# Patient Record
Sex: Female | Born: 1940 | Race: White | Hispanic: No | Smoking: Current every day smoker
Health system: Southern US, Community
[De-identification: ages and names within clinical notes are randomized; demographics above are authoritative.]

## PROBLEM LIST (undated history)

## (undated) DIAGNOSIS — K219 Gastro-esophageal reflux disease without esophagitis: Secondary | ICD-10-CM

## (undated) DIAGNOSIS — I10 Essential (primary) hypertension: Secondary | ICD-10-CM

## (undated) DIAGNOSIS — R0602 Shortness of breath: Secondary | ICD-10-CM

## (undated) DIAGNOSIS — C3490 Malignant neoplasm of unspecified part of unspecified bronchus or lung: Secondary | ICD-10-CM

## (undated) DIAGNOSIS — M549 Dorsalgia, unspecified: Secondary | ICD-10-CM

## (undated) DIAGNOSIS — G8929 Other chronic pain: Secondary | ICD-10-CM

## (undated) HISTORY — PX: APPENDECTOMY: SHX54

---

## 1970-09-18 HISTORY — PX: ABDOMINAL HYSTERECTOMY: SHX81

## 1999-05-13 ENCOUNTER — Encounter: Payer: Self-pay | Admitting: Internal Medicine

## 1999-05-13 ENCOUNTER — Ambulatory Visit (HOSPITAL_COMMUNITY): Admission: RE | Admit: 1999-05-13 | Discharge: 1999-05-13 | Payer: Self-pay | Admitting: Internal Medicine

## 2006-10-19 ENCOUNTER — Emergency Department (HOSPITAL_COMMUNITY): Admission: EM | Admit: 2006-10-19 | Discharge: 2006-10-19 | Payer: Self-pay | Admitting: Family Medicine

## 2008-04-16 ENCOUNTER — Ambulatory Visit: Payer: Self-pay | Admitting: Thoracic Surgery

## 2008-04-18 HISTORY — PX: FIBEROPTIC BRONCHOSCOPY: SHX5367

## 2008-04-22 ENCOUNTER — Ambulatory Visit: Payer: Self-pay | Admitting: Thoracic Surgery

## 2008-04-22 ENCOUNTER — Ambulatory Visit (HOSPITAL_COMMUNITY): Admission: RE | Admit: 2008-04-22 | Discharge: 2008-04-22 | Payer: Self-pay | Admitting: Thoracic Surgery

## 2008-04-24 ENCOUNTER — Ambulatory Visit (HOSPITAL_COMMUNITY): Admission: RE | Admit: 2008-04-24 | Discharge: 2008-04-24 | Payer: Self-pay | Admitting: Thoracic Surgery

## 2008-04-24 ENCOUNTER — Encounter: Payer: Self-pay | Admitting: Thoracic Surgery

## 2008-04-27 ENCOUNTER — Ambulatory Visit: Admission: RE | Admit: 2008-04-27 | Discharge: 2008-04-27 | Payer: Self-pay | Admitting: Thoracic Surgery

## 2008-04-28 ENCOUNTER — Ambulatory Visit: Payer: Self-pay | Admitting: Thoracic Surgery

## 2008-04-29 ENCOUNTER — Ambulatory Visit: Payer: Self-pay | Admitting: Internal Medicine

## 2008-05-04 LAB — CBC WITH DIFFERENTIAL/PLATELET
BASO%: 1 % (ref 0.0–2.0)
EOS%: 3.4 % (ref 0.0–7.0)
HCT: 40.7 % (ref 34.8–46.6)
LYMPH%: 23.6 % (ref 14.0–48.0)
MCH: 33.8 pg (ref 26.0–34.0)
MCHC: 34.3 g/dL (ref 32.0–36.0)
MCV: 98.6 fL (ref 81.0–101.0)
MONO%: 6.9 % (ref 0.0–13.0)
NEUT%: 65.1 % (ref 39.6–76.8)
Platelets: 533 10*3/uL — ABNORMAL HIGH (ref 145–400)
RBC: 4.13 10*6/uL (ref 3.70–5.32)
WBC: 12.2 10*3/uL — ABNORMAL HIGH (ref 3.9–10.0)

## 2008-05-04 LAB — COMPREHENSIVE METABOLIC PANEL
ALT: 14 U/L (ref 0–35)
Alkaline Phosphatase: 85 U/L (ref 39–117)
CO2: 26 mEq/L (ref 19–32)
Creatinine, Ser: 0.73 mg/dL (ref 0.40–1.20)
Sodium: 135 mEq/L (ref 135–145)
Total Bilirubin: 0.5 mg/dL (ref 0.3–1.2)
Total Protein: 7.3 g/dL (ref 6.0–8.3)

## 2008-05-05 ENCOUNTER — Ambulatory Visit: Admission: RE | Admit: 2008-05-05 | Discharge: 2008-07-04 | Payer: Self-pay | Admitting: Radiation Oncology

## 2008-05-18 LAB — CBC WITH DIFFERENTIAL/PLATELET
Basophils Absolute: 0.1 10*3/uL (ref 0.0–0.1)
Eosinophils Absolute: 0.6 10*3/uL — ABNORMAL HIGH (ref 0.0–0.5)
HGB: 14.1 g/dL (ref 11.6–15.9)
MONO#: 1.1 10*3/uL — ABNORMAL HIGH (ref 0.1–0.9)
NEUT#: 7.2 10*3/uL — ABNORMAL HIGH (ref 1.5–6.5)
RBC: 4.23 10*6/uL (ref 3.70–5.32)
RDW: 11.9 % (ref 11.3–14.5)
WBC: 11.7 10*3/uL — ABNORMAL HIGH (ref 3.9–10.0)

## 2008-05-18 LAB — COMPREHENSIVE METABOLIC PANEL
Albumin: 4.3 g/dL (ref 3.5–5.2)
Alkaline Phosphatase: 91 U/L (ref 39–117)
BUN: 17 mg/dL (ref 6–23)
CO2: 24 mEq/L (ref 19–32)
Glucose, Bld: 88 mg/dL (ref 70–99)
Sodium: 137 mEq/L (ref 135–145)
Total Bilirubin: 0.3 mg/dL (ref 0.3–1.2)
Total Protein: 7 g/dL (ref 6.0–8.3)

## 2008-05-20 ENCOUNTER — Ambulatory Visit: Payer: Self-pay | Admitting: Thoracic Surgery

## 2008-05-26 LAB — CBC WITH DIFFERENTIAL/PLATELET
Basophils Absolute: 0.1 10*3/uL (ref 0.0–0.1)
EOS%: 2.6 % (ref 0.0–7.0)
Eosinophils Absolute: 0.3 10*3/uL (ref 0.0–0.5)
HCT: 37.7 % (ref 34.8–46.6)
HGB: 13.3 g/dL (ref 11.6–15.9)
LYMPH%: 16.3 % (ref 14.0–48.0)
MCH: 33.7 pg (ref 26.0–34.0)
MCV: 95.5 fL (ref 81.0–101.0)
MONO%: 8.8 % (ref 0.0–13.0)
NEUT#: 7.2 10*3/uL — ABNORMAL HIGH (ref 1.5–6.5)
NEUT%: 71.5 % (ref 39.6–76.8)
Platelets: 368 10*3/uL (ref 145–400)
RDW: 11.9 % (ref 11.3–14.5)

## 2008-05-26 LAB — COMPREHENSIVE METABOLIC PANEL
AST: 21 U/L (ref 0–37)
Albumin: 4.2 g/dL (ref 3.5–5.2)
Alkaline Phosphatase: 87 U/L (ref 39–117)
BUN: 16 mg/dL (ref 6–23)
Creatinine, Ser: 0.64 mg/dL (ref 0.40–1.20)
Glucose, Bld: 122 mg/dL — ABNORMAL HIGH (ref 70–99)

## 2008-06-01 LAB — CBC WITH DIFFERENTIAL/PLATELET
BASO%: 1.3 % (ref 0.0–2.0)
Basophils Absolute: 0.1 10*3/uL (ref 0.0–0.1)
EOS%: 1 % (ref 0.0–7.0)
HCT: 38.7 % (ref 34.8–46.6)
HGB: 13.3 g/dL (ref 11.6–15.9)
LYMPH%: 21.6 % (ref 14.0–48.0)
MCH: 32.9 pg (ref 26.0–34.0)
MCHC: 34.5 g/dL (ref 32.0–36.0)
MCV: 95.3 fL (ref 81.0–101.0)
MONO%: 10.2 % (ref 0.0–13.0)
NEUT%: 65.9 % (ref 39.6–76.8)
lymph#: 1.6 10*3/uL (ref 0.9–3.3)

## 2008-06-01 LAB — COMPREHENSIVE METABOLIC PANEL
ALT: 18 U/L (ref 0–35)
AST: 14 U/L (ref 0–37)
Alkaline Phosphatase: 85 U/L (ref 39–117)
BUN: 19 mg/dL (ref 6–23)
Chloride: 104 mEq/L (ref 96–112)
Creatinine, Ser: 0.71 mg/dL (ref 0.40–1.20)
Total Bilirubin: 0.5 mg/dL (ref 0.3–1.2)

## 2008-06-08 LAB — CBC WITH DIFFERENTIAL/PLATELET
BASO%: 1.3 % (ref 0.0–2.0)
Basophils Absolute: 0.1 10*3/uL (ref 0.0–0.1)
EOS%: 0.9 % (ref 0.0–7.0)
Eosinophils Absolute: 0.1 10*3/uL (ref 0.0–0.5)
HCT: 37.5 % (ref 34.8–46.6)
HGB: 13 g/dL (ref 11.6–15.9)
LYMPH%: 17.5 % (ref 14.0–48.0)
MCH: 33.4 pg (ref 26.0–34.0)
MCHC: 34.6 g/dL (ref 32.0–36.0)
MCV: 96.4 fL (ref 81.0–101.0)
MONO#: 0.7 10*3/uL (ref 0.1–0.9)
MONO%: 7.8 % (ref 0.0–13.0)
NEUT#: 6.2 10*3/uL (ref 1.5–6.5)
NEUT%: 72.4 % (ref 39.6–76.8)
Platelets: 385 10*3/uL (ref 145–400)
RBC: 3.89 10*6/uL (ref 3.70–5.32)
RDW: 12.5 % (ref 11.3–14.5)
WBC: 8.5 10*3/uL (ref 3.9–10.0)
lymph#: 1.5 10*3/uL (ref 0.9–3.3)

## 2008-06-08 LAB — COMPREHENSIVE METABOLIC PANEL
ALT: 21 U/L (ref 0–35)
AST: 18 U/L (ref 0–37)
BUN: 11 mg/dL (ref 6–23)
CO2: 24 mEq/L (ref 19–32)
Calcium: 9.6 mg/dL (ref 8.4–10.5)
Chloride: 102 mEq/L (ref 96–112)
Creatinine, Ser: 0.63 mg/dL (ref 0.40–1.20)
Total Bilirubin: 0.7 mg/dL (ref 0.3–1.2)

## 2008-06-15 ENCOUNTER — Ambulatory Visit: Payer: Self-pay | Admitting: Internal Medicine

## 2008-06-15 LAB — CBC WITH DIFFERENTIAL/PLATELET
Basophils Absolute: 0.1 10*3/uL (ref 0.0–0.1)
HCT: 36.1 % (ref 34.8–46.6)
HGB: 12.7 g/dL (ref 11.6–15.9)
LYMPH%: 14.5 % (ref 14.0–48.0)
MONO#: 0.6 10*3/uL (ref 0.1–0.9)
NEUT%: 76.2 % (ref 39.6–76.8)
Platelets: 253 10*3/uL (ref 145–400)
WBC: 8 10*3/uL (ref 3.9–10.0)
lymph#: 1.2 10*3/uL (ref 0.9–3.3)

## 2008-06-15 LAB — COMPREHENSIVE METABOLIC PANEL
ALT: 22 U/L (ref 0–35)
AST: 19 U/L (ref 0–37)
Alkaline Phosphatase: 85 U/L (ref 39–117)
CO2: 24 mEq/L (ref 19–32)
Creatinine, Ser: 0.67 mg/dL (ref 0.40–1.20)
Sodium: 137 mEq/L (ref 135–145)
Total Bilirubin: 0.5 mg/dL (ref 0.3–1.2)
Total Protein: 7.2 g/dL (ref 6.0–8.3)

## 2008-06-22 LAB — COMPREHENSIVE METABOLIC PANEL
AST: 16 U/L (ref 0–37)
Albumin: 4.5 g/dL (ref 3.5–5.2)
Alkaline Phosphatase: 89 U/L (ref 39–117)
Potassium: 4.4 mEq/L (ref 3.5–5.3)
Sodium: 138 mEq/L (ref 135–145)
Total Protein: 7.3 g/dL (ref 6.0–8.3)

## 2008-06-22 LAB — CBC WITH DIFFERENTIAL/PLATELET
EOS%: 0.8 % (ref 0.0–7.0)
MCH: 33.1 pg (ref 26.0–34.0)
MCHC: 35.1 g/dL (ref 32.0–36.0)
MCV: 94.4 fL (ref 81.0–101.0)
MONO%: 9.2 % (ref 0.0–13.0)
RBC: 3.6 10*6/uL — ABNORMAL LOW (ref 3.70–5.32)
RDW: 13.4 % (ref 11.3–14.5)

## 2008-06-29 LAB — CBC WITH DIFFERENTIAL/PLATELET
Basophils Absolute: 0.1 10*3/uL (ref 0.0–0.1)
EOS%: 1.2 % (ref 0.0–7.0)
Eosinophils Absolute: 0.1 10*3/uL (ref 0.0–0.5)
HCT: 33.7 % — ABNORMAL LOW (ref 34.8–46.6)
HGB: 11.7 g/dL (ref 11.6–15.9)
MCH: 33.3 pg (ref 26.0–34.0)
NEUT#: 3.4 10*3/uL (ref 1.5–6.5)
NEUT%: 67.9 % (ref 39.6–76.8)
lymph#: 1.1 10*3/uL (ref 0.9–3.3)

## 2008-06-29 LAB — COMPREHENSIVE METABOLIC PANEL
AST: 20 U/L (ref 0–37)
Alkaline Phosphatase: 78 U/L (ref 39–117)
BUN: 16 mg/dL (ref 6–23)
Calcium: 9.3 mg/dL (ref 8.4–10.5)
Chloride: 104 mEq/L (ref 96–112)
Creatinine, Ser: 0.65 mg/dL (ref 0.40–1.20)
Glucose, Bld: 90 mg/dL (ref 70–99)

## 2008-07-27 ENCOUNTER — Ambulatory Visit (HOSPITAL_COMMUNITY): Admission: RE | Admit: 2008-07-27 | Discharge: 2008-07-27 | Payer: Self-pay | Admitting: Internal Medicine

## 2008-07-30 LAB — CBC WITH DIFFERENTIAL/PLATELET
BASO%: 0.4 % (ref 0.0–2.0)
EOS%: 0.8 % (ref 0.0–7.0)
HCT: 40.1 % (ref 34.8–46.6)
MCH: 34.3 pg — ABNORMAL HIGH (ref 26.0–34.0)
MCHC: 33.9 g/dL (ref 32.0–36.0)
MCV: 101.3 fL — ABNORMAL HIGH (ref 81.0–101.0)
MONO%: 9.2 % (ref 0.0–13.0)
NEUT%: 77.4 % — ABNORMAL HIGH (ref 39.6–76.8)
lymph#: 1.3 10*3/uL (ref 0.9–3.3)

## 2008-07-30 LAB — COMPREHENSIVE METABOLIC PANEL
ALT: 13 U/L (ref 0–35)
AST: 16 U/L (ref 0–37)
Albumin: 4.5 g/dL (ref 3.5–5.2)
Alkaline Phosphatase: 84 U/L (ref 39–117)
Potassium: 4.1 mEq/L (ref 3.5–5.3)
Sodium: 141 mEq/L (ref 135–145)
Total Bilirubin: 0.4 mg/dL (ref 0.3–1.2)
Total Protein: 7.3 g/dL (ref 6.0–8.3)

## 2008-07-31 ENCOUNTER — Ambulatory Visit: Payer: Self-pay | Admitting: Internal Medicine

## 2008-08-04 LAB — CBC WITH DIFFERENTIAL/PLATELET
EOS%: 0 % (ref 0.0–7.0)
LYMPH%: 2.1 % — ABNORMAL LOW (ref 14.0–48.0)
MCH: 33.8 pg (ref 26.0–34.0)
MCV: 99.9 fL (ref 81.0–101.0)
MONO%: 2.2 % (ref 0.0–13.0)
Platelets: 419 10*3/uL — ABNORMAL HIGH (ref 145–400)
RBC: 3.89 10*6/uL (ref 3.70–5.32)
RDW: 15 % — ABNORMAL HIGH (ref 11.3–14.5)

## 2008-08-04 LAB — COMPREHENSIVE METABOLIC PANEL
AST: 14 U/L (ref 0–37)
Albumin: 4.6 g/dL (ref 3.5–5.2)
Alkaline Phosphatase: 77 U/L (ref 39–117)
BUN: 16 mg/dL (ref 6–23)
Glucose, Bld: 121 mg/dL — ABNORMAL HIGH (ref 70–99)
Potassium: 4.2 mEq/L (ref 3.5–5.3)
Sodium: 140 mEq/L (ref 135–145)
Total Bilirubin: 0.3 mg/dL (ref 0.3–1.2)
Total Protein: 7.3 g/dL (ref 6.0–8.3)

## 2008-08-11 LAB — CBC WITH DIFFERENTIAL/PLATELET
Basophils Absolute: 0 10*3/uL (ref 0.0–0.1)
EOS%: 0.4 % (ref 0.0–7.0)
Eosinophils Absolute: 0.1 10*3/uL (ref 0.0–0.5)
HGB: 12.1 g/dL (ref 11.6–15.9)
LYMPH%: 7.5 % — ABNORMAL LOW (ref 14.0–48.0)
MCH: 34.7 pg — ABNORMAL HIGH (ref 26.0–34.0)
MCV: 101.3 fL — ABNORMAL HIGH (ref 81.0–101.0)
MONO%: 7 % (ref 0.0–13.0)
NEUT#: 17.2 10*3/uL — ABNORMAL HIGH (ref 1.5–6.5)
Platelets: 222 10*3/uL (ref 145–400)
RDW: 16.5 % — ABNORMAL HIGH (ref 11.3–14.5)

## 2008-08-11 LAB — COMPREHENSIVE METABOLIC PANEL
AST: 14 U/L (ref 0–37)
Alkaline Phosphatase: 98 U/L (ref 39–117)
BUN: 12 mg/dL (ref 6–23)
Creatinine, Ser: 0.88 mg/dL (ref 0.40–1.20)
Glucose, Bld: 106 mg/dL — ABNORMAL HIGH (ref 70–99)
Total Bilirubin: 0.3 mg/dL (ref 0.3–1.2)

## 2008-08-18 LAB — CBC WITH DIFFERENTIAL/PLATELET
Basophils Absolute: 0 10*3/uL (ref 0.0–0.1)
EOS%: 0.3 % (ref 0.0–7.0)
Eosinophils Absolute: 0.1 10*3/uL (ref 0.0–0.5)
HCT: 37.8 % (ref 34.8–46.6)
HGB: 12.8 g/dL (ref 11.6–15.9)
LYMPH%: 7.1 % — ABNORMAL LOW (ref 14.0–48.0)
MCH: 34.6 pg — ABNORMAL HIGH (ref 26.0–34.0)
MCV: 101.8 fL — ABNORMAL HIGH (ref 81.0–101.0)
MONO%: 3.8 % (ref 0.0–13.0)
NEUT#: 16.4 10*3/uL — ABNORMAL HIGH (ref 1.5–6.5)
NEUT%: 88.8 % — ABNORMAL HIGH (ref 39.6–76.8)
Platelets: 278 10*3/uL (ref 145–400)
RDW: 17.3 % — ABNORMAL HIGH (ref 11.3–14.5)

## 2008-08-18 LAB — COMPREHENSIVE METABOLIC PANEL
AST: 14 U/L (ref 0–37)
Albumin: 4.1 g/dL (ref 3.5–5.2)
Alkaline Phosphatase: 95 U/L (ref 39–117)
BUN: 15 mg/dL (ref 6–23)
Creatinine, Ser: 0.72 mg/dL (ref 0.40–1.20)
Glucose, Bld: 90 mg/dL (ref 70–99)
Potassium: 3.9 mEq/L (ref 3.5–5.3)

## 2008-08-25 LAB — CBC WITH DIFFERENTIAL/PLATELET
Basophils Absolute: 0 10*3/uL (ref 0.0–0.1)
Eosinophils Absolute: 0 10*3/uL (ref 0.0–0.5)
HCT: 36.4 % (ref 34.8–46.6)
HGB: 12.4 g/dL (ref 11.6–15.9)
MCV: 100.8 fL (ref 81.0–101.0)
MONO%: 0.3 % (ref 0.0–13.0)
NEUT#: 10.3 10*3/uL — ABNORMAL HIGH (ref 1.5–6.5)
NEUT%: 96.9 % — ABNORMAL HIGH (ref 39.6–76.8)
RDW: 16.7 % — ABNORMAL HIGH (ref 11.3–14.5)
lymph#: 0.3 10*3/uL — ABNORMAL LOW (ref 0.9–3.3)

## 2008-08-25 LAB — COMPREHENSIVE METABOLIC PANEL
Albumin: 4.6 g/dL (ref 3.5–5.2)
BUN: 21 mg/dL (ref 6–23)
Calcium: 9.9 mg/dL (ref 8.4–10.5)
Chloride: 103 mEq/L (ref 96–112)
Creatinine, Ser: 0.73 mg/dL (ref 0.40–1.20)
Glucose, Bld: 130 mg/dL — ABNORMAL HIGH (ref 70–99)
Potassium: 4.4 mEq/L (ref 3.5–5.3)

## 2008-09-01 LAB — COMPREHENSIVE METABOLIC PANEL
AST: 13 U/L (ref 0–37)
Albumin: 4 g/dL (ref 3.5–5.2)
BUN: 15 mg/dL (ref 6–23)
CO2: 23 mEq/L (ref 19–32)
Calcium: 8.7 mg/dL (ref 8.4–10.5)
Chloride: 104 mEq/L (ref 96–112)
Glucose, Bld: 120 mg/dL — ABNORMAL HIGH (ref 70–99)
Potassium: 4 mEq/L (ref 3.5–5.3)

## 2008-09-01 LAB — CBC WITH DIFFERENTIAL/PLATELET
Basophils Absolute: 0.2 10*3/uL — ABNORMAL HIGH (ref 0.0–0.1)
Eosinophils Absolute: 0 10*3/uL (ref 0.0–0.5)
HCT: 31.9 % — ABNORMAL LOW (ref 34.8–46.6)
HGB: 10.9 g/dL — ABNORMAL LOW (ref 11.6–15.9)
MONO#: 2.2 10*3/uL — ABNORMAL HIGH (ref 0.1–0.9)
NEUT#: 12.1 10*3/uL — ABNORMAL HIGH (ref 1.5–6.5)
RDW: 13.9 % (ref 11.3–14.5)
lymph#: 1 10*3/uL (ref 0.9–3.3)

## 2008-09-08 LAB — CBC WITH DIFFERENTIAL/PLATELET
BASO%: 0.3 % (ref 0.0–2.0)
Basophils Absolute: 0.1 10*3/uL (ref 0.0–0.1)
EOS%: 0.3 % (ref 0.0–7.0)
HGB: 11.9 g/dL (ref 11.6–15.9)
MCH: 34.6 pg — ABNORMAL HIGH (ref 26.0–34.0)
RDW: 15.8 % — ABNORMAL HIGH (ref 11.3–14.5)
WBC: 18.4 10*3/uL — ABNORMAL HIGH (ref 3.9–10.0)
lymph#: 1.1 10*3/uL (ref 0.9–3.3)

## 2008-09-08 LAB — COMPREHENSIVE METABOLIC PANEL
ALT: 14 U/L (ref 0–35)
AST: 14 U/L (ref 0–37)
Albumin: 3.9 g/dL (ref 3.5–5.2)
BUN: 15 mg/dL (ref 6–23)
Calcium: 9 mg/dL (ref 8.4–10.5)
Chloride: 101 mEq/L (ref 96–112)
Potassium: 4.3 mEq/L (ref 3.5–5.3)

## 2008-09-14 ENCOUNTER — Ambulatory Visit: Payer: Self-pay | Admitting: Internal Medicine

## 2008-09-15 LAB — COMPREHENSIVE METABOLIC PANEL
ALT: 19 U/L (ref 0–35)
AST: 17 U/L (ref 0–37)
Albumin: 4.2 g/dL (ref 3.5–5.2)
Calcium: 9.3 mg/dL (ref 8.4–10.5)
Chloride: 107 mEq/L (ref 96–112)
Potassium: 4.5 mEq/L (ref 3.5–5.3)

## 2008-09-15 LAB — CBC WITH DIFFERENTIAL/PLATELET
BASO%: 0.2 % (ref 0.0–2.0)
EOS%: 0 % (ref 0.0–7.0)
MCH: 33.9 pg (ref 26.0–34.0)
MCHC: 34.2 g/dL (ref 32.0–36.0)
RBC: 3.5 10*6/uL — ABNORMAL LOW (ref 3.70–5.32)
RDW: 14.9 % — ABNORMAL HIGH (ref 11.3–14.5)
lymph#: 0.6 10*3/uL — ABNORMAL LOW (ref 0.9–3.3)

## 2008-10-01 ENCOUNTER — Ambulatory Visit (HOSPITAL_COMMUNITY): Admission: RE | Admit: 2008-10-01 | Discharge: 2008-10-01 | Payer: Self-pay | Admitting: Internal Medicine

## 2008-12-29 ENCOUNTER — Ambulatory Visit: Payer: Self-pay | Admitting: Internal Medicine

## 2008-12-31 ENCOUNTER — Ambulatory Visit (HOSPITAL_COMMUNITY): Admission: RE | Admit: 2008-12-31 | Discharge: 2008-12-31 | Payer: Self-pay | Admitting: Internal Medicine

## 2009-04-07 ENCOUNTER — Ambulatory Visit: Payer: Self-pay | Admitting: Internal Medicine

## 2009-04-09 ENCOUNTER — Ambulatory Visit (HOSPITAL_COMMUNITY): Admission: RE | Admit: 2009-04-09 | Discharge: 2009-04-09 | Payer: Self-pay | Admitting: Internal Medicine

## 2009-04-09 LAB — CBC WITH DIFFERENTIAL/PLATELET
Basophils Absolute: 0.1 10*3/uL (ref 0.0–0.1)
EOS%: 0.6 % (ref 0.0–7.0)
Eosinophils Absolute: 0.1 10*3/uL (ref 0.0–0.5)
HGB: 15.9 g/dL (ref 11.6–15.9)
LYMPH%: 15.9 % (ref 14.0–49.7)
MCH: 35.1 pg — ABNORMAL HIGH (ref 25.1–34.0)
MCV: 100.1 fL (ref 79.5–101.0)
MONO%: 8.2 % (ref 0.0–14.0)
Platelets: 276 10*3/uL (ref 145–400)
RBC: 4.52 10*6/uL (ref 3.70–5.45)
RDW: 13.9 % (ref 11.2–14.5)

## 2009-04-09 LAB — COMPREHENSIVE METABOLIC PANEL
AST: 21 U/L (ref 0–37)
Albumin: 4.2 g/dL (ref 3.5–5.2)
Alkaline Phosphatase: 101 U/L (ref 39–117)
BUN: 11 mg/dL (ref 6–23)
Glucose, Bld: 100 mg/dL — ABNORMAL HIGH (ref 70–99)
Potassium: 4.4 mEq/L (ref 3.5–5.3)
Total Bilirubin: 0.8 mg/dL (ref 0.3–1.2)

## 2009-07-08 ENCOUNTER — Ambulatory Visit: Payer: Self-pay | Admitting: Internal Medicine

## 2009-07-12 ENCOUNTER — Ambulatory Visit (HOSPITAL_COMMUNITY): Admission: RE | Admit: 2009-07-12 | Discharge: 2009-07-12 | Payer: Self-pay | Admitting: Internal Medicine

## 2009-07-12 LAB — COMPREHENSIVE METABOLIC PANEL
AST: 22 U/L (ref 0–37)
Albumin: 3.9 g/dL (ref 3.5–5.2)
Alkaline Phosphatase: 87 U/L (ref 39–117)
Potassium: 4.1 mEq/L (ref 3.5–5.3)
Sodium: 141 mEq/L (ref 135–145)
Total Bilirubin: 0.7 mg/dL (ref 0.3–1.2)
Total Protein: 7.2 g/dL (ref 6.0–8.3)

## 2009-07-12 LAB — CBC WITH DIFFERENTIAL/PLATELET
EOS%: 2 % (ref 0.0–7.0)
LYMPH%: 19.2 % (ref 14.0–49.7)
MCH: 35 pg — ABNORMAL HIGH (ref 25.1–34.0)
MCHC: 34.2 g/dL (ref 31.5–36.0)
MCV: 102.3 fL — ABNORMAL HIGH (ref 79.5–101.0)
MONO%: 8.8 % (ref 0.0–14.0)
RBC: 4.33 10*6/uL (ref 3.70–5.45)
RDW: 14.6 % — ABNORMAL HIGH (ref 11.2–14.5)

## 2009-10-05 ENCOUNTER — Ambulatory Visit: Payer: Self-pay | Admitting: Internal Medicine

## 2009-10-07 ENCOUNTER — Ambulatory Visit (HOSPITAL_COMMUNITY): Admission: RE | Admit: 2009-10-07 | Discharge: 2009-10-07 | Payer: Self-pay | Admitting: Internal Medicine

## 2009-10-07 LAB — COMPREHENSIVE METABOLIC PANEL
ALT: 19 U/L (ref 0–35)
AST: 24 U/L (ref 0–37)
Albumin: 4 g/dL (ref 3.5–5.2)
Alkaline Phosphatase: 83 U/L (ref 39–117)
BUN: 6 mg/dL (ref 6–23)
CO2: 26 mEq/L (ref 19–32)
Calcium: 8.9 mg/dL (ref 8.4–10.5)
Glucose, Bld: 86 mg/dL (ref 70–99)
Potassium: 4.3 mEq/L (ref 3.5–5.3)
Total Bilirubin: 0.5 mg/dL (ref 0.3–1.2)
Total Protein: 7.3 g/dL (ref 6.0–8.3)

## 2009-10-07 LAB — CBC WITH DIFFERENTIAL/PLATELET
BASO%: 0.4 % (ref 0.0–2.0)
Eosinophils Absolute: 0.2 10*3/uL (ref 0.0–0.5)
HCT: 45.2 % (ref 34.8–46.6)
MCH: 35 pg — ABNORMAL HIGH (ref 25.1–34.0)
MCHC: 34.1 g/dL (ref 31.5–36.0)
MCV: 102.7 fL — ABNORMAL HIGH (ref 79.5–101.0)
NEUT#: 4.2 10*3/uL (ref 1.5–6.5)
RBC: 4.4 10*6/uL (ref 3.70–5.45)
RDW: 13.6 % (ref 11.2–14.5)

## 2009-10-19 ENCOUNTER — Ambulatory Visit (HOSPITAL_COMMUNITY): Admission: RE | Admit: 2009-10-19 | Discharge: 2009-10-19 | Payer: Self-pay | Admitting: Internal Medicine

## 2010-01-17 ENCOUNTER — Ambulatory Visit (HOSPITAL_COMMUNITY): Admission: RE | Admit: 2010-01-17 | Discharge: 2010-01-17 | Payer: Self-pay | Admitting: Internal Medicine

## 2010-01-17 ENCOUNTER — Ambulatory Visit: Payer: Self-pay | Admitting: Internal Medicine

## 2010-01-17 LAB — COMPREHENSIVE METABOLIC PANEL
Albumin: 4.3 g/dL (ref 3.5–5.2)
Alkaline Phosphatase: 73 U/L (ref 39–117)
BUN: 18 mg/dL (ref 6–23)
CO2: 27 mEq/L (ref 19–32)
Calcium: 9.2 mg/dL (ref 8.4–10.5)
Potassium: 4.3 mEq/L (ref 3.5–5.3)

## 2010-01-17 LAB — CBC WITH DIFFERENTIAL/PLATELET
BASO%: 0.7 % (ref 0.0–2.0)
HCT: 43.1 % (ref 34.8–46.6)
MCH: 34.9 pg — ABNORMAL HIGH (ref 25.1–34.0)
MCV: 101.5 fL — ABNORMAL HIGH (ref 79.5–101.0)
NEUT#: 5.3 10*3/uL (ref 1.5–6.5)
NEUT%: 69.8 % (ref 38.4–76.8)
Platelets: 308 10*3/uL (ref 145–400)
RBC: 4.24 10*6/uL (ref 3.70–5.45)
RDW: 13.8 % (ref 11.2–14.5)
WBC: 7.6 10*3/uL (ref 3.9–10.3)
lymph#: 1.3 10*3/uL (ref 0.9–3.3)

## 2010-02-22 ENCOUNTER — Encounter: Payer: Self-pay | Admitting: Internal Medicine

## 2010-02-22 ENCOUNTER — Observation Stay (HOSPITAL_COMMUNITY): Admission: EM | Admit: 2010-02-22 | Discharge: 2010-02-24 | Payer: Self-pay | Admitting: Emergency Medicine

## 2010-02-22 ENCOUNTER — Ambulatory Visit: Payer: Self-pay | Admitting: Internal Medicine

## 2010-05-17 ENCOUNTER — Ambulatory Visit: Payer: Self-pay | Admitting: Internal Medicine

## 2010-05-19 ENCOUNTER — Ambulatory Visit (HOSPITAL_COMMUNITY)
Admission: RE | Admit: 2010-05-19 | Discharge: 2010-05-19 | Payer: Self-pay | Source: Home / Self Care | Admitting: Internal Medicine

## 2010-05-19 LAB — COMPREHENSIVE METABOLIC PANEL
ALT: 21 U/L (ref 0–35)
AST: 23 U/L (ref 0–37)
Albumin: 4.4 g/dL (ref 3.5–5.2)
Alkaline Phosphatase: 81 U/L (ref 39–117)
Calcium: 9.5 mg/dL (ref 8.4–10.5)
Glucose, Bld: 91 mg/dL (ref 70–99)
Potassium: 4.4 mEq/L (ref 3.5–5.3)
Total Protein: 7.5 g/dL (ref 6.0–8.3)

## 2010-05-19 LAB — CBC WITH DIFFERENTIAL/PLATELET
BASO%: 0.9 % (ref 0.0–2.0)
HCT: 45 % (ref 34.8–46.6)
LYMPH%: 12.6 % — ABNORMAL LOW (ref 14.0–49.7)
MCHC: 34.2 g/dL (ref 31.5–36.0)
MCV: 99.9 fL (ref 79.5–101.0)
RBC: 4.5 10*6/uL (ref 3.70–5.45)
WBC: 8.2 10*3/uL (ref 3.9–10.3)

## 2010-09-05 IMAGING — CT CT CHEST W/ CM
2 of 3 series · 15 of 36 positions shown, 18 images · IV contrast (agent unspecified)
Comparison: Chest radiographs dated 04/22/2008 and PET CT dated
04/22/2008.

CLINICAL DATA: Completed chemotherapy and radiation therapy for
lung cancer diagnosed in [REDACTED] of this year.  Some cough, weight
loss and shortness of breath.

CT CHEST WITH CONTRAST
TECHNIQUE: Multidetector CT imaging of the chest was performed
following the standard protocol during bolus administration of
intravenous contrast.
Contrast: 80 ml Emnipaque-5II

[Series 2: chest routine 5.0 b40f · axial · 0.69mm/px · z∈[-345,-55]mm · 12 of 70 slices shown, 15 images]
[im 6/70  mediastinal]
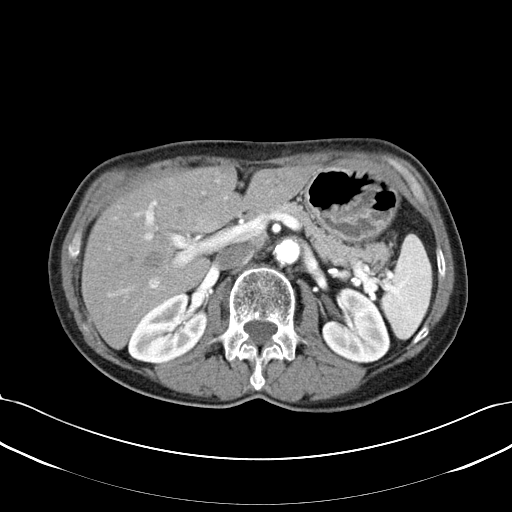
[im 6/70  lung]
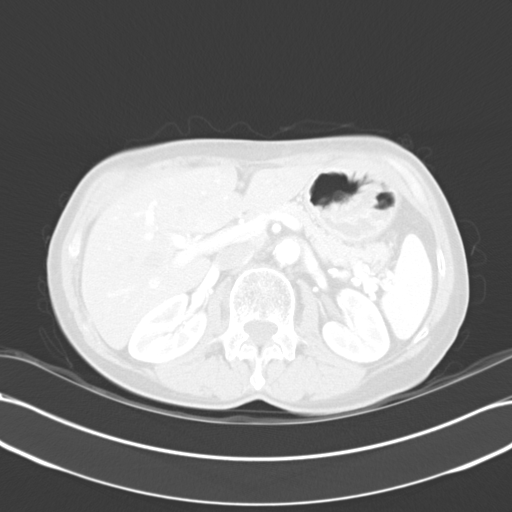
[im 11/70  lung]
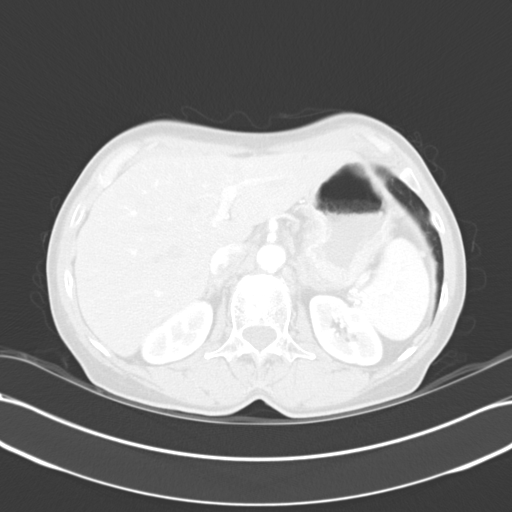
[im 16/70  lung]
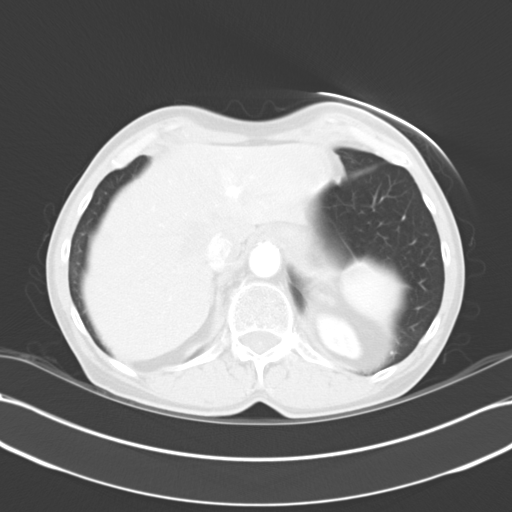
[im 21/70  lung]
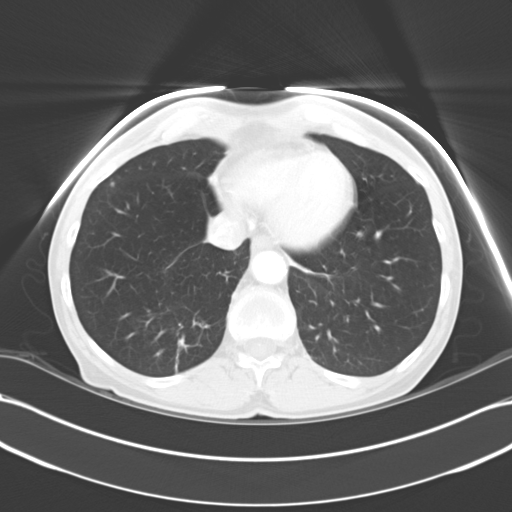
[im 26/70  mediastinal]
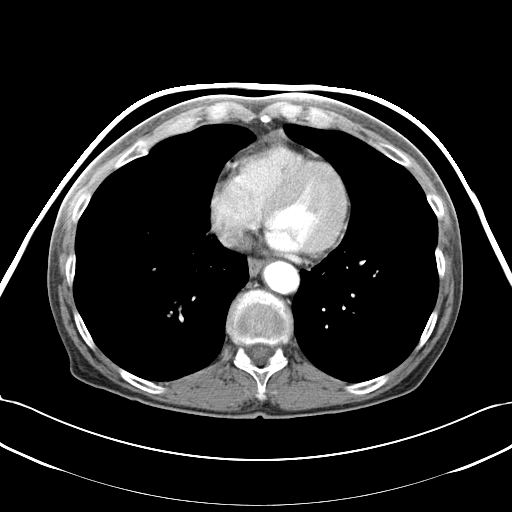
[im 26/70  lung]
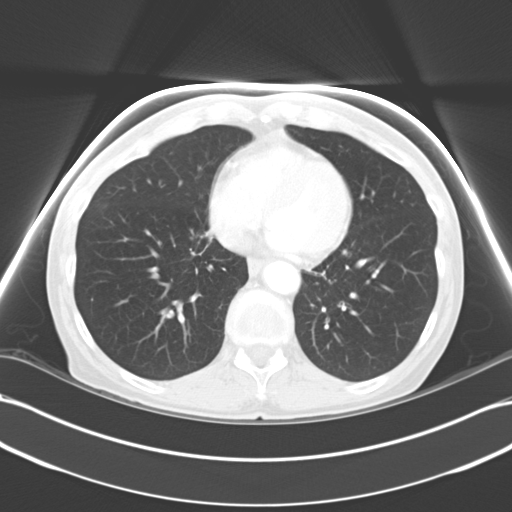
[im 31/70  lung]
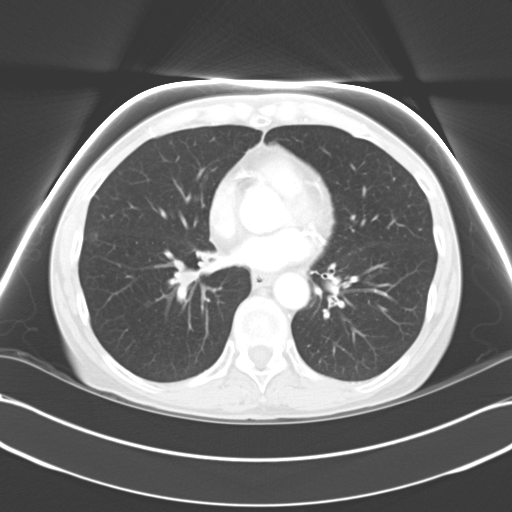
[im 39/70  lung]
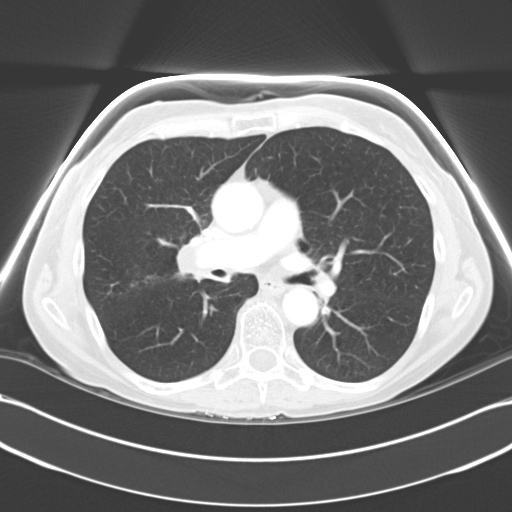
[im 44/70  lung]
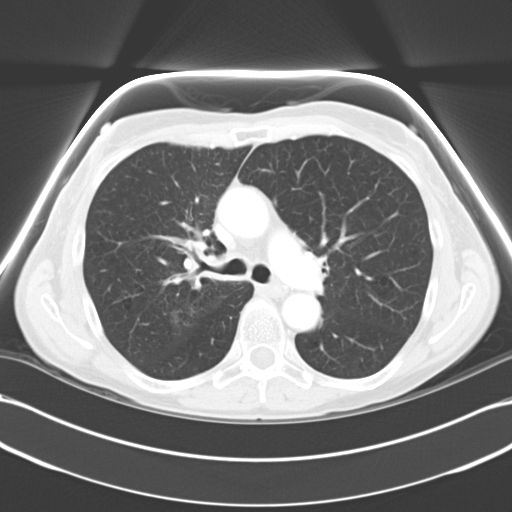
[im 49/70  mediastinal]
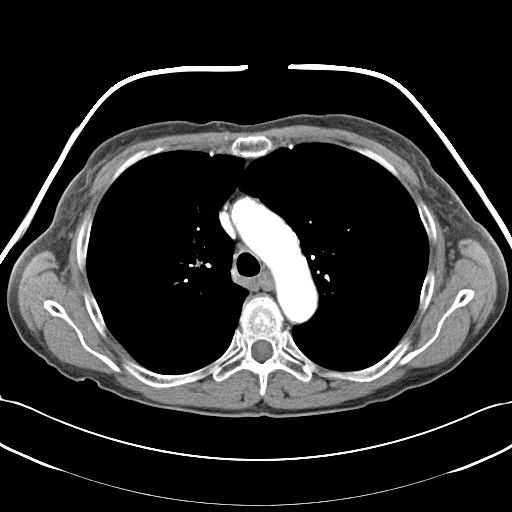
[im 49/70  lung]
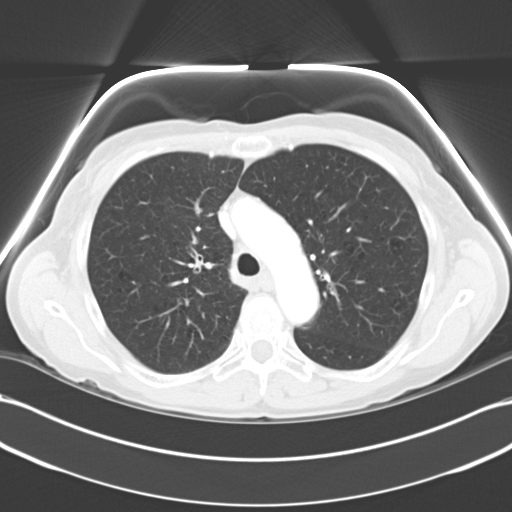
[im 54/70  lung]
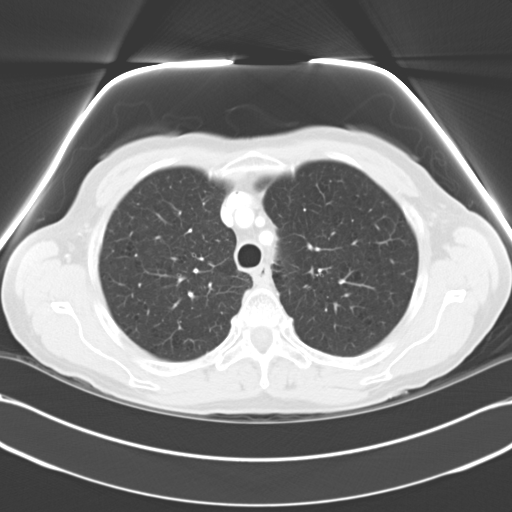
[im 59/70  lung]
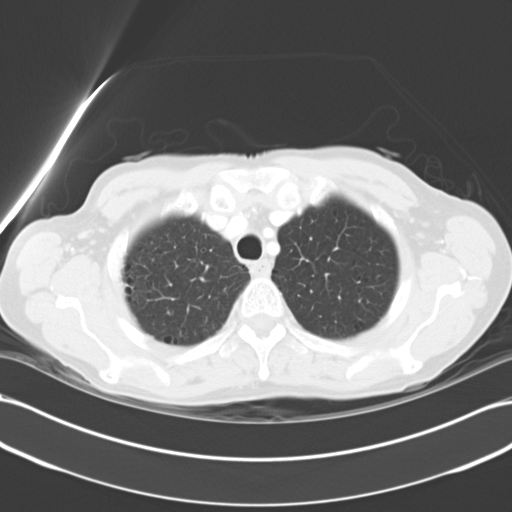
[im 64/70  lung]
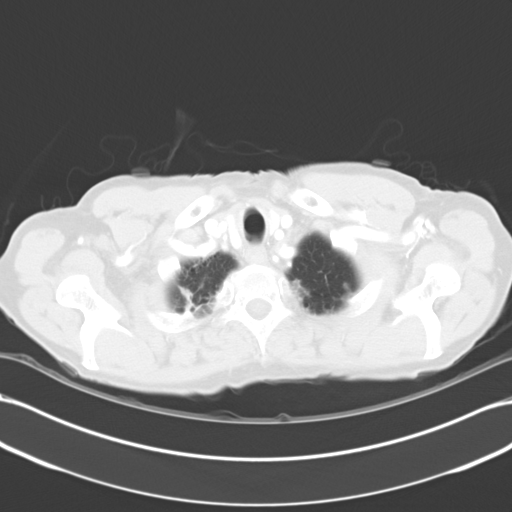

[Series 602: <mpr thick range> · coronal · 0.69mm/px · 3 of 79 slices shown]
[im 16/79  lung]
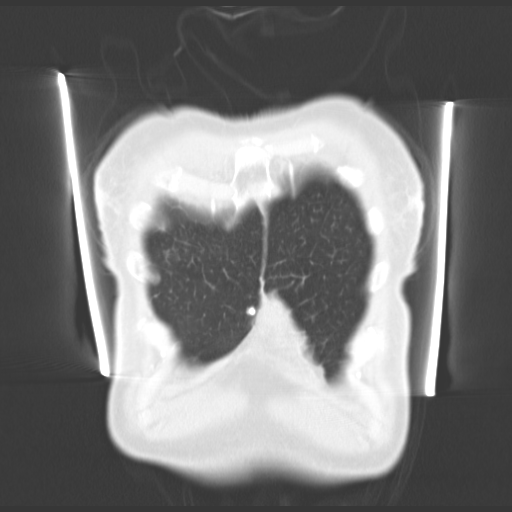
[im 32/79  lung]
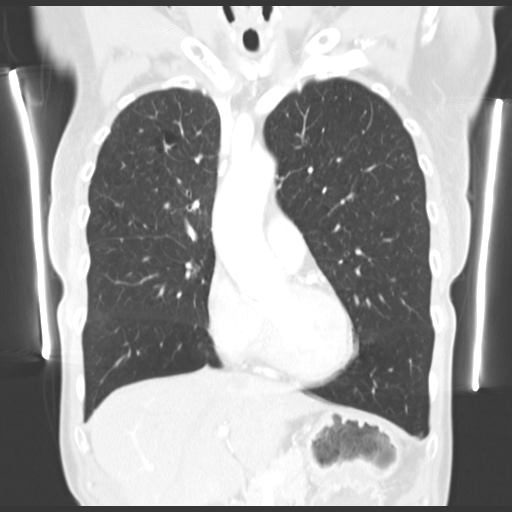
[im 47/79  lung]
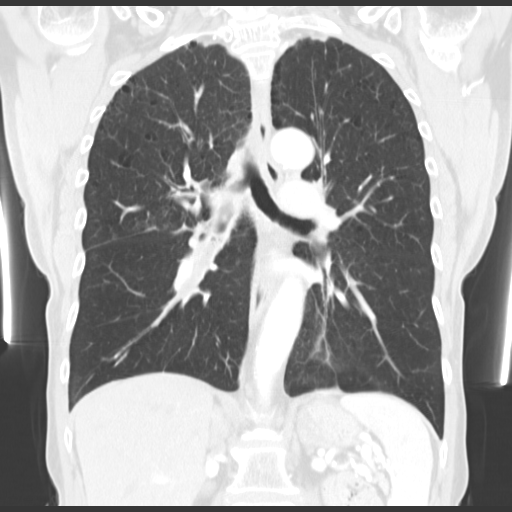

[15 of 36 positions shown; findings below may reference images not displayed]

FINDINGS: The previously demonstrated 3.4 cm right hilar mass is
significantly smaller, currently measuring 2.4 cm in maximum
diameter on image number 30.  There is interval low density
material filling the proximal and midportions of the right lower
lobe bronchi.  No enlarged lymph nodes are seen.  Two previously
demonstrated tiny right upper lobe nodules are no longer seen.  A
previously demonstrated 4 mm right lower lobe nodule is no longer
seen.  Two tiny right middle lobe nodules are stable as well as a
tiny inferior right upper lobe nodule.  Small bilateral calcified
granulomata are unchanged.  There has been interval development of
a 4 mm nodule along the lateral aspect of the inferior portion of
the major fissure on the right, measured on image number 50.  Mild
changes of COPD are stable.  Thoracic spine degenerative changes
are noted as well as an old 40% L1 vertebral compression deformity.
No evidence of bony metastatic disease. Small calcified splenic
granulomata are noted.  No masses seen in the adrenal glands and
included portions of the liver.
IMPRESSION: 1.  Significant decrease in the size of the right hilar mass.
2.  Resolution of some of the previously seen small right lung
nodules with interval visualization of a 4 mm nodule in the
anterior aspect of the right lower lobe.
3.  Interval right lower lobe mucus plugging.
4.  Calcified granulomata in both lungs and in the spleen.

## 2010-09-14 ENCOUNTER — Ambulatory Visit: Payer: Self-pay | Admitting: Internal Medicine

## 2010-09-16 ENCOUNTER — Ambulatory Visit (HOSPITAL_COMMUNITY)
Admission: RE | Admit: 2010-09-16 | Discharge: 2010-09-16 | Payer: Self-pay | Source: Home / Self Care | Attending: Internal Medicine | Admitting: Internal Medicine

## 2010-09-16 LAB — CBC WITH DIFFERENTIAL/PLATELET
EOS%: 0.7 % (ref 0.0–7.0)
HCT: 46.9 % — ABNORMAL HIGH (ref 34.8–46.6)
HGB: 16.1 g/dL — ABNORMAL HIGH (ref 11.6–15.9)
LYMPH%: 13.2 % — ABNORMAL LOW (ref 14.0–49.7)
MCH: 35.4 pg — ABNORMAL HIGH (ref 25.1–34.0)
MCHC: 34.4 g/dL (ref 31.5–36.0)
MCV: 103 fL — ABNORMAL HIGH (ref 79.5–101.0)
MONO#: 0.7 10*3/uL (ref 0.1–0.9)
MONO%: 8.1 % (ref 0.0–14.0)
RBC: 4.55 10*6/uL (ref 3.70–5.45)
RDW: 15 % — ABNORMAL HIGH (ref 11.2–14.5)

## 2010-09-16 LAB — CMP (CANCER CENTER ONLY)
AST: 30 U/L (ref 11–38)
Alkaline Phosphatase: 66 U/L (ref 26–84)
BUN, Bld: 10 mg/dL (ref 7–22)
CO2: 27 mEq/L (ref 18–33)
Potassium: 4.5 mEq/L (ref 3.3–4.7)
Sodium: 145 mEq/L (ref 128–145)
Total Protein: 7.5 g/dL (ref 6.4–8.1)

## 2010-09-21 ENCOUNTER — Ambulatory Visit: Payer: Self-pay | Admitting: Internal Medicine

## 2010-10-08 ENCOUNTER — Other Ambulatory Visit: Payer: Self-pay | Admitting: Internal Medicine

## 2010-10-08 DIAGNOSIS — C3490 Malignant neoplasm of unspecified part of unspecified bronchus or lung: Secondary | ICD-10-CM

## 2010-10-20 NOTE — Initial Assessments (Signed)
INTERNAL MEDICINE ADMISSION HISTORY AND PHYSICAL  First Contact: Joyice Faster, [place] [phone]) Second Contact: Dr. Comer Locket [phone])  Weekends, Holidays, or after 5pm weekdays:  First Contact: [phone] Second Contact:[phone]   PCP:  Dr. Jeannetta Nap in [facility]  CC: Right shoulder pain  HPI:  Pt is a 70 y/o woman with PMH of recent non-small cell lung cancer, alcohol and tobacco abuse who presents with right shoulder pain secondary to a fall.  On Sunday 02/20/10 she was walking up her front stairs and tripped onto the concrete, falling on her right shoulder.  Pt does not recall feeling dizzy, faint, chest pain, palpitations before or after the fall.  She does not believe she lost consciousness.  Her housemate found her and "popped" her shoulder back into place.  She had had three drinks of alcohol that day, usual for her, and denies being intoxicated.  Pt felt extreme pain in right shoulder and right ribs since the fall but did not present to the hospital until today when her daughter-in-law insisted.   ALLERGIES:  NKDA  PAST MEDICAL HISTORY:  1)  Stage IIIa non small cell lung cancer, diagnosed in 2009.  No surgical tx.  Last round of chemo/radiation was Jan 2010.  Latest PET scan (Feb 2011) shows no evidence of recurrent or metastatic disease.  Still being followed by Dr. Shirline Frees, oncologist.  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  Her son lives next door and daughter behind her house.  She smokes 1.5 packs/day now, down from 2-2.5 packs.  100+ pack years.  She drinks a fifth of liquor per week.     FAMILY HISTORY  Father - alcoholism, passed of liver failure Mother -  healthy, passed at age 45 One sister died of PE at age 58  ROS: + productive cough each morning, otherwise neg except per HPI  VITALS:  T: 98.1  P: 110, 104 BP: 147/89, 145/88 R:18  O2SAT: 93 ON RA  PHYSICAL EXAM:  Gen: Patient is in NAD, pleasant, sitting up in bed. Eyes: PERRL, EOMI, no signs of jaundice. ENT: MMM, no erythema, thrush or exudates.  Poor dentition.   Neck: Supple, no carotid bruits, no JVD, no thyromegaly. Resp: CTA- Bilaterally, no W/C/R. CVS: nl S1S2, RRR, no M/R/G GI: Abdomen is soft. ND, NT, NG, NR, BS+. No organomegaly. Ext: No pedal edema or cyanosis.  2+ DP pulses.   GU: No CVA tenderness. Skin: Extensive bruising over right shoulder.  No visible rashes, scars. Lymph: No palpable cervical or supraclavicular lymphadenopathy. MS: Tenderness on palpation of posterior right thoracic ribs.  Unable to move right shoulder due to extreme pain.   Neuro: A&O, CN II - XII are grossly intact. Motor strength is 5/5 in lower extremities and left upper, unable to assess right extremity due to pain.  Able to move all digits on right hand. Psych: Appropriate  LABS:  CBC   15.5>14.1/40.0<286     MCV  100.3  BMap 134/4.0/99/25/9/0.73<130/9.4  Right Humerus  Displaced subcapital fracture of the proximal right humerus.  Right Shoulder  Right humeral neck fracture with impaction.  CXR  No  evidence of acute cardiopulmonary disease. Right humeral neck fracture.   ASSESSMENT AND PLAN:  Pt is a 70 y/o woman with PMH of non-small cell lung cancer and EtOH abuse who presents with right humeral fracture.    1)  Fall - Likely secondary to loss of balance.  EtOH intake could have contributed.  History is not concerning for syncopal episode.  EKG shows sinus tach with no ST segment elevation/depression.  We are drawing cardiac enzymes.    2)  Right humeral fracture - ED physician talked to the Orthopedics over the phone who recommended a shoulder brace, pain management,  OP follow up in a week. No evidence of rib fractures on CXR, management would not change even if found.  Percocet for pain control.  3)  Leukocytosis - Likely secondary to humeral fracture.  Pt is afebrile, no clinical signs or symptoms of infection.  CXR shows no infiltrate.    4)  Chronic productive cough - Likely COPD.  We have no records of PFTs.  Pt states she uses someone else's "purple inhaler" 2-3 times/year.  Not short of breath currently.  Workup as outpt.    5)  EtOH abuse - On CIWA protocol, thiamine and folate.  MCV 100.3.  Vit B12 and folate levels pending.  EtOH blood level pending.    6)  Tobacco abuse - Nicotine patch.  Encouraged tobacco cessation.    7)  Potassium - Pt takes OTC potassium at home not prescribed by a doctor.  Her level today is 4.0 so no need to supplement.  We stressed to patient that potassium can be dangerous when not prescribed by a physician.    VTE PROPH: lovenox   I performed and/or observed a history and physical examination of the patient.  I discussed the case with the residents as noted and reviewed the residents' notes.  I agree with the findings and plan--please refer to the attending physician note for more details.  Signature   Printed Name

## 2010-12-05 LAB — CBC
HCT: 40 % (ref 36.0–46.0)
Hemoglobin: 14.1 g/dL (ref 12.0–15.0)
MCHC: 35.1 g/dL (ref 30.0–36.0)
MCV: 100.3 fL — ABNORMAL HIGH (ref 78.0–100.0)
Platelets: 286 10*3/uL (ref 150–400)
RDW: 13.6 % (ref 11.5–15.5)
WBC: 15.5 10*3/uL — ABNORMAL HIGH (ref 4.0–10.5)

## 2010-12-05 LAB — BASIC METABOLIC PANEL
Calcium: 9.4 mg/dL (ref 8.4–10.5)
GFR calc non Af Amer: >60 mL/min (ref >60)
Glucose, Bld: 130 mg/dL — ABNORMAL HIGH (ref 70–99)
Potassium: 4 mEq/L (ref 3.5–5.1)
Sodium: 134 mEq/L — ABNORMAL LOW (ref 135–145)

## 2010-12-05 LAB — DRUGS OF ABUSE SCREEN W/O ALC, ROUTINE URINE
Benzodiazepines.: NEGATIVE
Cocaine Metabolites: NEGATIVE
Creatinine,U: 136.5 mg/dL
Phencyclidine (PCP): NEGATIVE
Propoxyphene: NEGATIVE

## 2010-12-05 LAB — URINALYSIS, ROUTINE W REFLEX MICROSCOPIC
Bilirubin Urine: NEGATIVE
Ketones, ur: NEGATIVE mg/dL
Nitrite: NEGATIVE
Protein, ur: NEGATIVE mg/dL
Urobilinogen, UA: 0.2 mg/dL (ref 0.0–1.0)
pH: 6.5 (ref 5.0–8.0)

## 2010-12-05 LAB — CARDIAC PANEL(CRET KIN+CKTOT+MB+TROPI)
CK, MB: 1.3 ng/mL (ref 0.3–4.0)
Relative Index: 0.9 (ref 0.0–2.5)
Total CK: 150 U/L (ref 7–177)

## 2010-12-05 LAB — TSH: TSH: 4.224 u[IU]/mL (ref 0.350–4.500)

## 2010-12-05 LAB — VITAMIN B12: Vitamin B-12: 307 pg/mL (ref 211–911)

## 2010-12-05 LAB — OPIATE, QUANTITATIVE, URINE
Oxycodone, ur: NEGATIVE NG/ML
Oxymorphone: NEGATIVE NG/ML

## 2010-12-28 LAB — COMPREHENSIVE METABOLIC PANEL
Albumin: 4.2 g/dL (ref 3.5–5.2)
Alkaline Phosphatase: 103 U/L (ref 39–117)
BUN: 8 mg/dL (ref 6–23)
Chloride: 104 mEq/L (ref 96–112)
Glucose, Bld: 95 mg/dL (ref 70–99)
Potassium: 4.1 mEq/L (ref 3.5–5.1)
Total Bilirubin: 0.4 mg/dL (ref 0.3–1.2)

## 2010-12-28 LAB — CBC
MCHC: 34.8 g/dL (ref 30.0–36.0)
MCV: 96.6 fL (ref 78.0–100.0)
Platelets: 327 10*3/uL (ref 150–400)
RDW: 13.6 % (ref 11.5–15.5)

## 2011-01-31 NOTE — Letter (Signed)
April 28, 2008   Windle Guard, M.D.  [address]  [place], [place] [address]   Re:  LUVERNA, DEGENHART               DOB:  04/05/1941   Dear Andrey Campanile,   I saw the patient back in the office today and informed her that on her  bronchoscopy and mediastinoscopy, she had malignant cells.  She had a  non-small cell cancer at the right lower lobe on her brushings and  washings as well as on her mediastinoscopy.  Her pulmonary function test  showed an FVC of 2.72 with an FEV1 of 1.28 and a 66% diffusion capacity.  She apparently has a stage IIIA non-small cell lung cancer.  I referred  her to Dr. Arbutus Ped for radiation and chemotherapy.   I appreciate the opportunity of seeing the patient.  Her mediastinoscopy  incision is well healed and I will see her back in 3 weeks to check on  the healing.   Ines Bloomer, M.D.  Electronically Signed   DPB/MEDQ  D:  04/28/2008  T:  04/29/2008  Job:  [id number]   cc:   Lajuana Matte, MD

## 2011-01-31 NOTE — Assessment & Plan Note (Signed)
OFFICE VISIT   [name], Lori  DOB:  06/25/1941                                        May 20, 2008  CHART #:  [id number]   The patient came today.  Her mediastinoscopy incision was well healed.  She has fall and hurt her left foot and was seeing an orthopedist about  this.  She has been seeing Dr. Arbutus Ped and Dr. Mitzi Hansen and started on  radiation and chemotherapy.  Her blood pressure is 142/81, pulse 82,  respirations 18, and sats were 96%.  I will see her back again after she  completes her radiation, if she is a possible candidate for surgery.   Lori [name], M.D.  Electronically Signed   DPB/MEDQ  D:  05/20/2008  T:  05/20/2008  Job:  [id number]

## 2011-01-31 NOTE — Letter (Signed)
April 16, 2008   Windle Guard, MD  [address]  [place], [place] [address]   Re:  Lori [name], Lori [name]               DOB:  12/05/1940   Dear Andrey Campanile,   I appreciate the opportunity of seeing the patient.  This 70 year old  Caucasian female has a long history of smoking and still continues to  smoke at least a pack of cigarettes a day.  She had two episodes of  hemoptysis both of which were self-limited.  A chest x-ray showed a  right hilar mass, and a CT scan showed a right hilar mass involving the  right upper lobe with involvement of the pulmonary artery.  The mass was  5.6 x 4.0 x 2.5 with compression of the right upper lobe with no marked  mediastinal adenopathy.  She has had no fever, chills, or excessive  sputum, no weight loss, but she gets shortness of breath with exertion.   Her medications include vitamin D and Fosamax.  She has no allergies.  No other medications.   She is a widow and has three children.  Smokes a pack and a half a day  and takes 2-3 shots of alcohol 2-3 times a week.   Her family history is noncontributory.   REVIEW OF SYSTEMS:  She is 217 pounds.  She is 5 feet 8 inches.  General:  Her weight has been stable, maybe some mild weight loss.  Cardiac:  No angina or atrial fibrillation.  She gets shortness of  breath with exertion.  Pulmonary:  See history of present illness and  she also has had a productive cough.  GI:  No nausea, vomiting,  constipation, or diarrhea.  GU:  No kidney disease, dysuria, or frequent  urination.  Vascular:  No claudication, DVT, or TIAs.  Neurologic:  No dizziness,  headaches, blackouts, or seizures.  Musculoskeletal:  She has got some  back pain on the left side.  No arthritis.  Psychiatric:  No depression  or nervousness.  Eye/ENT:  No change in her eyesight or hearing.  Hematologic:  No problems with bleeding, clotting disorders, or anemia.   PHYSICAL EXAMINATION:  GENERAL:  She is a thin Caucasian female with  no  acute distress.  VITAL SIGNS:  Her sats are 96%.  Her blood pressure is 186/100, pulse  100, and respirations 18.  HEAD, EYES, EARS, NOSE, AND THROAT:  Unremarkable.  NECK:  Supple without thyromegaly.  There is no supraclavicular or  axillary adenopathy.  CHEST:  Clear to auscultation and percussion.  HEART:  Regular sinus rhythm.  No murmurs.  ABDOMEN:  Soft.  EXTREMITIES:  Pulses are 2+.  There is no clubbing or edema.  NEUROLOGIC:  She is oriented x3.  Sensory and motor intact.  Cranial  nerves intact.   I feel she has probably a non-small cell lung cancer.  We plan to do a  PET scan, full set of pulmonary function tests, and a brain scan and  then I will see her back again after that and schedule her for a  bronchoscopy and possible mediastinoscopy, which we tentatively will do  on August 7th, at [facility].  I appreciate the opportunity of seeing  the patient.   Ines Bloomer, M.D.  Electronically Signed   DPB/MEDQ  D:  04/16/2008  T:  04/17/2008  Job:  [id number]

## 2011-01-31 NOTE — Op Note (Signed)
Lori [name], Lori [name]             ACCOUNT NO.:  [id number]   MEDICAL RECORD NO.:  [id number]          PATIENT TYPE:  AMB   LOCATION:  SDS                          FACILITY:  [facility]   PHYSICIAN:  Ines Bloomer, M.D. DATE OF BIRTH:  [birth year]   DATE OF PROCEDURE:  DATE OF DISCHARGE:  04/24/2008                               OPERATIVE REPORT   PREOPERATIVE DIAGNOSIS:  Right upper lobe mass.   POSTOPERATIVE DIAGNOSES:  Right upper lobe mass.   OPERATION PERFORMED:  Fiberoptic bronchoscopy and mediastinoscopy.   PROCEDURE:  Under general anesthesia, the patient was turned and  underwent fiberoptic bronchoscopy.  The carina was in the midline.  The  left mainstem, left upper lobe orifices were normal.  The right  mainstem, right lower lobe, and right middle lobe orifices were normal.  On the posterior segment of the right upper lobe, there was narrowing  and evidence of probable tumor.  Brushings and biopsies were taken from  this area as well as washings.  The video bronchoscope was removed.  The  anterior neck was prepped and draped in usual sterile manner.  Transverse incision was made.  This was carried down with electrocautery  through the subcutaneous tissue and fascia.  Pretracheal fascia was  entered and biopsies of a 4R and 7 node.  The seventh node appeared to  be positive on the PET scan.  Strap muscles were closed with 2-0 Vicryl  subcutaneous tissue, 3-0 Vicryl and Dermabond for the skin.      Ines Bloomer, M.D.  Electronically Signed     DPB/MEDQ  D:  04/24/2008  T:  04/25/2008  Job:  [id number]

## 2011-03-20 ENCOUNTER — Ambulatory Visit (HOSPITAL_COMMUNITY)
Admission: RE | Admit: 2011-03-20 | Discharge: 2011-03-20 | Disposition: A | Discharge status: Home / Self Care | Payer: PRIVATE HEALTH INSURANCE | Source: Ambulatory Visit | Attending: Internal Medicine | Admitting: Internal Medicine

## 2011-03-20 ENCOUNTER — Other Ambulatory Visit: Payer: Self-pay | Admitting: Internal Medicine

## 2011-03-20 ENCOUNTER — Encounter (HOSPITAL_BASED_OUTPATIENT_CLINIC_OR_DEPARTMENT_OTHER): Payer: PRIVATE HEALTH INSURANCE | Admitting: Internal Medicine

## 2011-03-20 DIAGNOSIS — Z4789 Encounter for other orthopedic aftercare: Secondary | ICD-10-CM | POA: Insufficient documentation

## 2011-03-20 DIAGNOSIS — C3490 Malignant neoplasm of unspecified part of unspecified bronchus or lung: Secondary | ICD-10-CM | POA: Insufficient documentation

## 2011-03-20 DIAGNOSIS — Z5111 Encounter for antineoplastic chemotherapy: Secondary | ICD-10-CM

## 2011-03-20 DIAGNOSIS — S32009A Unspecified fracture of unspecified lumbar vertebra, initial encounter for closed fracture: Secondary | ICD-10-CM | POA: Insufficient documentation

## 2011-03-20 DIAGNOSIS — Z79899 Other long term (current) drug therapy: Secondary | ICD-10-CM | POA: Insufficient documentation

## 2011-03-20 DIAGNOSIS — X58XXXA Exposure to other specified factors, initial encounter: Secondary | ICD-10-CM | POA: Insufficient documentation

## 2011-03-20 DIAGNOSIS — C3410 Malignant neoplasm of upper lobe, unspecified bronchus or lung: Secondary | ICD-10-CM

## 2011-03-20 DIAGNOSIS — J984 Other disorders of lung: Secondary | ICD-10-CM | POA: Insufficient documentation

## 2011-03-20 LAB — CBC WITH DIFFERENTIAL/PLATELET
BASO%: 0.6 % (ref 0.0–2.0)
EOS%: 0.5 % (ref 0.0–7.0)
Eosinophils Absolute: 0 10*3/uL (ref 0.0–0.5)
MCV: 102.1 fL — ABNORMAL HIGH (ref 79.5–101.0)
MONO%: 11.8 % (ref 0.0–14.0)
NEUT#: 7.1 10*3/uL — ABNORMAL HIGH (ref 1.5–6.5)
RBC: 4.28 10*6/uL (ref 3.70–5.45)
RDW: 13.6 % (ref 11.2–14.5)
WBC: 9.1 10*3/uL (ref 3.9–10.3)

## 2011-03-20 LAB — CMP (CANCER CENTER ONLY)
ALT(SGPT): 52 U/L — ABNORMAL HIGH (ref 10–47)
AST: 69 U/L — ABNORMAL HIGH (ref 11–38)
Albumin: 4.6 g/dL (ref 3.3–5.5)
Alkaline Phosphatase: 96 U/L — ABNORMAL HIGH (ref 26–84)
Glucose, Bld: 123 mg/dL — ABNORMAL HIGH (ref 73–118)
Potassium: 4.3 mEq/L (ref 3.3–4.7)
Sodium: 136 mEq/L (ref 128–145)
Total Protein: 7.9 g/dL (ref 6.4–8.1)

## 2011-03-20 MED ORDER — IOHEXOL 300 MG/ML  SOLN
80.0000 mL | Freq: Once | INTRAMUSCULAR | Status: AC | PRN
  Administered 2011-03-20: 80 mL via INTRAVENOUS

## 2011-03-28 ENCOUNTER — Other Ambulatory Visit: Payer: Self-pay | Admitting: Internal Medicine

## 2011-03-28 ENCOUNTER — Encounter (HOSPITAL_BASED_OUTPATIENT_CLINIC_OR_DEPARTMENT_OTHER): Payer: PRIVATE HEALTH INSURANCE | Admitting: Internal Medicine

## 2011-03-28 DIAGNOSIS — C3490 Malignant neoplasm of unspecified part of unspecified bronchus or lung: Secondary | ICD-10-CM

## 2011-03-28 DIAGNOSIS — C3410 Malignant neoplasm of upper lobe, unspecified bronchus or lung: Secondary | ICD-10-CM

## 2011-05-03 NOTE — Discharge Summary (Signed)
[name], [name]             ACCOUNT NO.:  [id number]  MEDICAL RECORD NO.:  [id number]          PATIENT TYPE:  OBV  LOCATION:  5011                         FACILITY:  [facility]  PHYSICIAN:  Lori [name], M.D.DATE OF BIRTH:  01-11-1941  DATE OF ADMISSION:  02/22/2010 DATE OF DISCHARGE:  02/24/2010                              DISCHARGE SUMMARY  DISCHARGE DIAGNOSES: 1. Fall secondary to loss of balance. 2. Right humeral fracture. 3. Non-small cell lung cancer. 4. Alcohol abuse. 5. Chronic obstructive pulmonary disease. 6. Tobacco use. 7. Osteoporosis.  DISCHARGE MEDICATIONS: 1. Percocet 5/325 mg p.o. q.4 h. p.r.n. pain. 2. Alendronate Fosamax 70 mg p.o. weekly. 3. Calcium carbonate OTC p.o. daily. 4. Multivitamins p.o. daily. 5. Potassium chloride OTC 1 tablet p.o. daily. 6. Vitamin D 50,000 units p.o. q.2 weeks.  DISPOSITION AND FOLLOWUP:  The patient is to follow up this week with Orthopedic, Dr. Ranell [name] for a right humeral fracture.  The patient is also following up with her PCP Dr. Jeannetta [name] on Friday March 11, 2010, at 9:45 a.m.  At that time, please follow up on her respiratory status and likely COPD.  The patient is not currently on any rescue or long-term management.  PROCEDURES: 1. Chest x-ray:  Impression, no evidence of acute cardiopulmonary     disease.  Right humeral neck fracture.  COPD and emphysema. 2. Right should x-ray, 2-view:  Impression, right humeral neck     fracture with impaction. 3. Right humerus x-ray, 2-view:  Impression, displaced subcapital     fracture of the proximal right humerus.  CONSULT:  Orthopedics.  While the patient was in the emergency department, the ED physician consulted Ortho who looked at the films and said for her to be placed in a sling with ice packs.  Dr. Ranell [name] came by the next day and determined that the patient's right humeral fracture could be treated with conservative management instead of surgery.  She will  follow up with him and his outpatient practice.  ADMITTING HISTORY AND PHYSICAL:  The patient is a 70 year old woman with past medical history of recent non-small cell lung cancer, alcohol and tobacco abuse who presents with right shoulder pain secondary to a fall. On Sunday February 20, 2010, she was walking up her front stairs and tripped on to the concrete falling on her right shoulder.  The patient does not recall feeling dizzy, faint, chest pain, or palpitations before or after the fall.  She does not believe she lost consciousness.  Her husband found her "popped" her shoulder back into place.  She had had 3 drinks of alcohol that day, usual for her, and denies being intoxicated.  The patient felt extreme pain in right shoulder and right ribs since the fall, but did not present to the hospital until today when her daughter- in-law insisted.  PHYSICAL EXAMINATION:  VITAL SIGNS:  On admission, temperature 98.1, pulse 110, blood pressure 147/89, respiratory rate 18, and O2 sats 93% on room air. GENERAL:  The patient is in no acute distress, sitting up in bed. HEENT:  Eyes:  Pupils equal, round, and reacting to light.  No signs of jaundice.  ENT:  Mucous membrane, no erythema, thrush, or exudates. Poor dentition. NECK:  Supple.  No carotid bruits.  No JVD.  No thyromegaly. RESPIRATORY:  Clear to auscultation bilaterally.  No wheezes, crackles, or rales. CARDIOVASCULAR:  Normal S1 and S2.  Regular, rate, and rhythm.  No murmurs, no rubs, no gallops. GI:  Abdomen was soft, nondistended.  No guarding.  No rebound. Positive bowel sounds.  No organomegaly. EXTREMITIES:  No pedal edema or cyanosis, 2+ DP pulses. GU:  No CVA tenderness. SKIN:  Extensive bruising over right shoulder.  No visible rashes or scars. LYMPH:  No palpable cervical or supraclavicular lymphadenopathy. MUSCULOSKELETAL:  Tenderness on palpitation on posterior right thoracic ribs.  Unable to move right shoulder due to  extreme pain. NEUROLOGIC:  Alert and oriented.  Cranial nerves II through XII are grossly intact.  Motor strength is 5/5 in lower extremities and left upper, unable to access right extremity due to pain.  Able to move all digits on her right hand. PSYCHIATRY:  Appropriate.  LABORATORY DATA:  CBC:  White count 15.5, hemoglobin 14.1, hematocrit 40.0, platelets 286, and MCV of 100.3.  BMET:  Sodium 134, potassium 4.0, chloride 99, bicarb 25, BUN 9, creatinine 0.73, glucose 130, calcium 9.4.  HOSPITAL COURSE: 1. Fall likely secondary to loss of balance.  Alcohol intake may have     contributed.  History was not concerning for syncopal episode.  EKG     and cardiac enzymes ruled out acute MI or arrhythmias. 2. Right humeral fracture.  The patient suffered a right humeral     fracture without should dislocation when she fell on her concrete     porch.  X-rays confirmed fracture.  Ortho was consulted and     recommended arm placed in the sling and ice packs until outpatient     followup.  Treatment course is to be conservative therapy with     surgery unlikely.  Pain control and Percocet was adequate while Ms.     Kowalke was inpatient.  She was discharged on Percocet 5/325 mg     by mouth q.4 h. p.r.n. pain x45 tablets.  The patient can follow up     with Orthopedics if more pain control is necessary. 3. Non-small cell lung cancer.  No active issues while inpatient.     Diagnosed in 2009, followed by Dr. Arbutus [name].  Last radiation and     chemotherapy treatment was January 2010.  PET scan in November 07, 2009, showed no evidence of recurrent disease or metastasis.  The     patient has appointment with oncologist in September. 4. Alcohol abuse.  The patient was put on CIWA protocol with scores     less than 10 throughout hospital course.  She was also put on     thiamine and folate while inpatient.  The patient drinks a fifth of     liquor each week. 5. Tobacco use.  Given nicotine  patch while inpatient and counseled on     the importance of tobacco cessation. 6. COPD.  The patient's chronic productive cough is unchanged     according to the patient.  Not on any meds at home.  PFTs     apparently were measured during lung cancer assessment, but we do     not have access to these records.  PCP to followup.  The patient     without shortness of breath throughout hospital course and was  sating in mid 90s on room air.  DISCHARGE LABORATORY DATA AND VITALS:  Labs were not drawn on the day of discharge.  Her vitals were temperature  98.4, pulse 96, respiratory rate 18, blood pressure 125/80, O2 sats 93% on room air.     Blondell Reveal, MD   ______________________________ Lori [name], M.D.   VB/MEDQ  D:  02/24/2010  T:  02/25/2010  Job:  [id number]  cc:   Windle Guard, M.D.  Electronically Signed by Eliezer Lofts M.D. on 05/03/2011 10:47:28 AM

## 2011-06-16 LAB — COMPREHENSIVE METABOLIC PANEL
Alkaline Phosphatase: 76
BUN: 7
Chloride: 100
Creatinine, Ser: 0.76
GFR calc non Af Amer: >60
Glucose, Bld: 112 — ABNORMAL HIGH
Potassium: 3.8
Total Bilirubin: 1

## 2011-06-16 LAB — CBC
HCT: 41.9
Hemoglobin: 14.2
MCHC: 34
MCV: 99
RBC: 4.24
WBC: 15.8 — ABNORMAL HIGH

## 2011-06-16 LAB — AFB CULTURE WITH SMEAR (NOT AT ARMC): Acid Fast Smear: NONE SEEN

## 2011-06-16 LAB — GLUCOSE, CAPILLARY: Glucose-Capillary: 139 — ABNORMAL HIGH

## 2011-06-16 LAB — FUNGUS CULTURE W SMEAR

## 2011-06-16 LAB — PROTIME-INR
INR: 1.1
Prothrombin Time: 14

## 2011-06-16 LAB — CULTURE, RESPIRATORY W GRAM STAIN

## 2011-08-28 ENCOUNTER — Emergency Department (HOSPITAL_COMMUNITY): Payer: Medicare Other

## 2011-08-28 ENCOUNTER — Encounter: Payer: Self-pay | Admitting: Family Medicine

## 2011-08-28 ENCOUNTER — Other Ambulatory Visit: Payer: Self-pay

## 2011-08-28 ENCOUNTER — Inpatient Hospital Stay (HOSPITAL_COMMUNITY)
Admission: EM | Admit: 2011-08-28 | Discharge: 2011-09-05 | DRG: 335 | Disposition: A | Discharge status: Home / Self Care | Payer: Medicare Other | Attending: Internal Medicine | Admitting: Internal Medicine

## 2011-08-28 DIAGNOSIS — E46 Unspecified protein-calorie malnutrition: Secondary | ICD-10-CM | POA: Diagnosis present

## 2011-08-28 DIAGNOSIS — E873 Alkalosis: Secondary | ICD-10-CM | POA: Diagnosis present

## 2011-08-28 DIAGNOSIS — I10 Essential (primary) hypertension: Secondary | ICD-10-CM | POA: Diagnosis present

## 2011-08-28 DIAGNOSIS — K565 Intestinal adhesions [bands], unspecified as to partial versus complete obstruction: Secondary | ICD-10-CM

## 2011-08-28 DIAGNOSIS — G9341 Metabolic encephalopathy: Secondary | ICD-10-CM | POA: Diagnosis present

## 2011-08-28 DIAGNOSIS — E871 Hypo-osmolality and hyponatremia: Secondary | ICD-10-CM | POA: Diagnosis present

## 2011-08-28 DIAGNOSIS — R0602 Shortness of breath: Secondary | ICD-10-CM

## 2011-08-28 DIAGNOSIS — E876 Hypokalemia: Secondary | ICD-10-CM | POA: Diagnosis present

## 2011-08-28 DIAGNOSIS — F17200 Nicotine dependence, unspecified, uncomplicated: Secondary | ICD-10-CM | POA: Diagnosis present

## 2011-08-28 DIAGNOSIS — Z9181 History of falling: Secondary | ICD-10-CM

## 2011-08-28 DIAGNOSIS — N179 Acute kidney failure, unspecified: Secondary | ICD-10-CM

## 2011-08-28 DIAGNOSIS — D649 Anemia, unspecified: Secondary | ICD-10-CM | POA: Diagnosis present

## 2011-08-28 DIAGNOSIS — K929 Disease of digestive system, unspecified: Secondary | ICD-10-CM | POA: Diagnosis not present

## 2011-08-28 DIAGNOSIS — E870 Hyperosmolality and hypernatremia: Secondary | ICD-10-CM | POA: Diagnosis present

## 2011-08-28 DIAGNOSIS — K56609 Unspecified intestinal obstruction, unspecified as to partial versus complete obstruction: Secondary | ICD-10-CM

## 2011-08-28 DIAGNOSIS — K560 Paralytic ileus: Secondary | ICD-10-CM | POA: Diagnosis not present

## 2011-08-28 DIAGNOSIS — D72829 Elevated white blood cell count, unspecified: Secondary | ICD-10-CM | POA: Diagnosis present

## 2011-08-28 DIAGNOSIS — Z23 Encounter for immunization: Secondary | ICD-10-CM

## 2011-08-28 DIAGNOSIS — Z85118 Personal history of other malignant neoplasm of bronchus and lung: Secondary | ICD-10-CM

## 2011-08-28 DIAGNOSIS — N19 Unspecified kidney failure: Secondary | ICD-10-CM

## 2011-08-28 HISTORY — DX: Shortness of breath: R06.02

## 2011-08-28 HISTORY — DX: Dorsalgia, unspecified: M54.9

## 2011-08-28 HISTORY — DX: Other chronic pain: G89.29

## 2011-08-28 HISTORY — DX: Gastro-esophageal reflux disease without esophagitis: K21.9

## 2011-08-28 HISTORY — DX: Essential (primary) hypertension: I10

## 2011-08-28 HISTORY — DX: Malignant neoplasm of unspecified part of unspecified bronchus or lung: C34.90

## 2011-08-28 LAB — POCT I-STAT TROPONIN I

## 2011-08-28 LAB — DIFFERENTIAL
Basophils Absolute: 0 10*3/uL (ref 0.0–0.1)
Eosinophils Relative: 0 % (ref 0–5)
Lymphocytes Relative: 4 % — ABNORMAL LOW (ref 12–46)

## 2011-08-28 LAB — COMPREHENSIVE METABOLIC PANEL
ALT: 13 U/L (ref 0–35)
AST: 21 U/L (ref 0–37)
CO2: 47 mEq/L (ref 19–32)
Calcium: 10.2 mg/dL (ref 8.4–10.5)
Sodium: 119 mEq/L — CL (ref 135–145)
Total Protein: 7 g/dL (ref 6.0–8.3)

## 2011-08-28 LAB — POCT I-STAT 3, ART BLOOD GAS (G3+)
Acid-Base Excess: 23 mmol/L — ABNORMAL HIGH (ref 0.0–2.0)
Bicarbonate: 50.5 mEq/L — ABNORMAL HIGH (ref 20.0–24.0)
O2 Saturation: 97 %
pCO2 arterial: 60 mmHg (ref 35.0–45.0)
pO2, Arterial: 84 mmHg (ref 80.0–100.0)

## 2011-08-28 LAB — URINALYSIS, ROUTINE W REFLEX MICROSCOPIC
Glucose, UA: 100 mg/dL — AB
Hgb urine dipstick: NEGATIVE
Specific Gravity, Urine: 1.015 (ref 1.005–1.030)
pH: 5 (ref 5.0–8.0)

## 2011-08-28 LAB — LACTIC ACID, PLASMA: Lactic Acid, Venous: 1.4 mmol/L (ref 0.5–2.2)

## 2011-08-28 LAB — GLUCOSE, CAPILLARY: Glucose-Capillary: 154 mg/dL — ABNORMAL HIGH (ref 70–99)

## 2011-08-28 LAB — CBC
MCV: 97.4 fL (ref 78.0–100.0)
Platelets: 266 10*3/uL (ref 150–400)
RDW: 13.4 % (ref 11.5–15.5)
WBC: 23 10*3/uL — ABNORMAL HIGH (ref 4.0–10.5)

## 2011-08-28 LAB — PROCALCITONIN: Procalcitonin: 0.75 ng/mL

## 2011-08-28 MED ORDER — PIPERACILLIN-TAZOBACTAM 3.375 G IVPB: 3.3750 g | Freq: Once | INTRAVENOUS | Status: DC

## 2011-08-28 MED ORDER — PIPERACILLIN-TAZOBACTAM 3.375 G IVPB
3.3750 g | INTRAVENOUS | Status: AC
  Administered 2011-08-29: 3.375 g via INTRAVENOUS
  Filled 2011-08-28: qty 50

## 2011-08-28 MED ORDER — POTASSIUM CHLORIDE 10 MEQ/100ML IV SOLN
10.0000 meq | INTRAVENOUS | Status: AC
  Administered 2011-08-28 – 2011-08-29 (×6): 10 meq via INTRAVENOUS
  Filled 2011-08-28: qty 200

## 2011-08-28 MED ORDER — THIAMINE HCL 100 MG/ML IJ SOLN
INTRAVENOUS | Status: DC
  Administered 2011-08-29 (×2): via INTRAVENOUS
  Filled 2011-08-28 (×6): qty 1000

## 2011-08-28 MED ORDER — ALBUTEROL SULFATE (5 MG/ML) 0.5% IN NEBU
2.5000 mg | INHALATION_SOLUTION | Freq: Once | RESPIRATORY_TRACT | Status: AC
  Administered 2011-08-28: 2.5 mg via RESPIRATORY_TRACT
  Filled 2011-08-28: qty 0.5

## 2011-08-28 MED ORDER — FENTANYL CITRATE 0.05 MG/ML IJ SOLN: 50.0000 ug | Freq: Once | INTRAMUSCULAR | Status: DC

## 2011-08-28 MED ORDER — HYDROMORPHONE HCL PF 1 MG/ML IJ SOLN
0.5000 mg | INTRAMUSCULAR | Status: DC | PRN
  Administered 2011-08-29: 03:00:00 via INTRAVENOUS
  Administered 2011-08-29 – 2011-08-30 (×4): 0.5 mg via INTRAVENOUS
  Filled 2011-08-28 (×5): qty 1

## 2011-08-28 MED ORDER — VANCOMYCIN HCL IN DEXTROSE 1-5 GM/200ML-% IV SOLN: 1000.0000 mg | Freq: Once | INTRAVENOUS | Status: DC

## 2011-08-28 MED ORDER — ONDANSETRON HCL 4 MG/2ML IJ SOLN
4.0000 mg | Freq: Once | INTRAMUSCULAR | Status: AC
  Administered 2011-08-28: 4 mg via INTRAVENOUS
  Filled 2011-08-28: qty 2

## 2011-08-28 MED ORDER — HYDROMORPHONE HCL PF 1 MG/ML IJ SOLN
0.5000 mg | Freq: Once | INTRAMUSCULAR | Status: AC
  Administered 2011-08-28: 0.5 mg via INTRAVENOUS
  Filled 2011-08-28: qty 1

## 2011-08-28 MED ORDER — HYDROMORPHONE HCL PF 2 MG/ML IJ SOLN
INTRAMUSCULAR | Status: AC
  Administered 2011-08-28: 2 mg
  Filled 2011-08-28: qty 1

## 2011-08-28 MED ORDER — IPRATROPIUM BROMIDE 0.02 % IN SOLN
0.5000 mg | Freq: Once | RESPIRATORY_TRACT | Status: AC
  Administered 2011-08-28: 0.5 mg via RESPIRATORY_TRACT
  Filled 2011-08-28: qty 2.5

## 2011-08-28 MED ORDER — PANTOPRAZOLE SODIUM 40 MG IV SOLR
40.0000 mg | INTRAVENOUS | Status: DC
  Administered 2011-08-28 – 2011-09-04 (×8): 40 mg via INTRAVENOUS
  Filled 2011-08-28 (×9): qty 40

## 2011-08-28 MED ORDER — POTASSIUM CHLORIDE 10 MEQ/100ML IV SOLN
10.0000 meq | Freq: Once | INTRAVENOUS | Status: AC
  Administered 2011-08-28: 10 meq via INTRAVENOUS

## 2011-08-28 MED ORDER — SODIUM CHLORIDE 0.9 % IV BOLUS (SEPSIS)
1000.0000 mL | INTRAVENOUS | Status: AC
  Administered 2011-08-28 – 2011-08-29 (×3): 1000 mL via INTRAVENOUS

## 2011-08-28 MED ORDER — FENTANYL CITRATE 0.05 MG/ML IJ SOLN
50.0000 ug | Freq: Once | INTRAMUSCULAR | Status: AC
  Administered 2011-08-28: 50 ug via INTRAVENOUS
  Filled 2011-08-28: qty 2

## 2011-08-28 MED ORDER — SODIUM CHLORIDE 0.9 % IV BOLUS (SEPSIS)
1000.0000 mL | Freq: Once | INTRAVENOUS | Status: AC
  Administered 2011-08-28 (×2): 1000 mL via INTRAVENOUS

## 2011-08-28 MED ORDER — ONDANSETRON HCL 4 MG/2ML IJ SOLN
INTRAMUSCULAR | Status: AC
  Filled 2011-08-28: qty 2

## 2011-08-28 MED ORDER — ONDANSETRON HCL 4 MG/2ML IJ SOLN
4.0000 mg | Freq: Four times a day (QID) | INTRAMUSCULAR | Status: DC | PRN
  Administered 2011-08-29: 4 mg via INTRAVENOUS
  Filled 2011-08-28: qty 2

## 2011-08-28 MED ORDER — SODIUM CHLORIDE 0.9 % IV SOLN
Freq: Once | INTRAVENOUS | Status: AC
  Administered 2011-08-28: 23:00:00 via INTRAVENOUS

## 2011-08-28 NOTE — H&P (Signed)
Dr. Lindie Spruce at bedside; notified pt's reported h/o drinking 1/5th every night plus 2 beers.

## 2011-08-28 NOTE — Consult Note (Signed)
Reason for Consult:  Abdominal pain, nausea and vomiting Referring Physician: EDP/Glick  Lori [name] is an 70 y.o. female.  HPI: Sick for over one week with nausea and vomiting, but would not seek medical attention.  Worse over the last 2 days.  Past Medical History  Diagnosis Date  . Lung cancer   . Hypertension     Past Surgical History  Procedure Date  . Abdominal hysterectomy   . Appendectomy     History reviewed. No pertinent family history.  Social History:  reports that she has been smoking.  She does not have any smokeless tobacco history on file. She reports that she drinks alcohol. She reports that she does not use illicit drugs.  Allergies: No Known Allergies  Medications: Awaiting medication reconciliation  Results for orders placed during the hospital encounter of 08/28/11 (from the past 48 hour(s))  GLUCOSE, CAPILLARY     Status: Abnormal   Collection Time   08/28/11  5:47 PM      Component Value Range Comment   Glucose-Capillary 154 (*) 70 - 99 (mg/dL)   CBC     Status: Abnormal   Collection Time   08/28/11  6:34 PM      Component Value Range Comment   WBC 23.0 (*) 4.0 - 10.5 (K/uL)    RBC 4.21  3.87 - 5.11 (MIL/uL)    Hemoglobin 15.1 (*) 12.0 - 15.0 (g/dL)    HCT 16.1  09.6 - 04.5 (%)    MCV 97.4  78.0 - 100.0 (fL)    MCH 35.9 (*) 26.0 - 34.0 (pg)    MCHC 36.8 (*) 30.0 - 36.0 (g/dL)    RDW 40.9  81.1 - 91.4 (%)    Platelets 266  150 - 400 (K/uL)   DIFFERENTIAL     Status: Abnormal   Collection Time   08/28/11  6:34 PM      Component Value Range Comment   Neutrophils Relative 75  43 - 77 (%)    Neutro Abs 17.2 (*) 1.7 - 7.7 (K/uL)    Lymphocytes Relative 4 (*) 12 - 46 (%)    Lymphs Abs 0.9  0.7 - 4.0 (K/uL)    Monocytes Relative 21 (*) 3 - 12 (%)    Monocytes Absolute 4.9 (*) 0.1 - 1.0 (K/uL)    Eosinophils Relative 0  0 - 5 (%)    Eosinophils Absolute 0.0  0.0 - 0.7 (K/uL)    Basophils Relative 0  0 - 1 (%)    Basophils Absolute 0.0  0.0 -  0.1 (K/uL)   COMPREHENSIVE METABOLIC PANEL     Status: Abnormal   Collection Time   08/28/11  6:34 PM      Component Value Range Comment   Sodium 119 (*) 135 - 145 (mEq/L)    Potassium 2.5 (*) 3.5 - 5.1 (mEq/L)    Chloride <65 (*) 96 - 112 (mEq/L)    CO2 47 (*) 19 - 32 (mEq/L)    Glucose, Bld 121 (*) 70 - 99 (mg/dL)    BUN 79 (*) 6 - 23 (mg/dL)    Creatinine, Ser 7.82 (*) 0.50 - 1.10 (mg/dL)    Calcium 95.6  8.4 - 10.5 (mg/dL)    Total Protein 7.0  6.0 - 8.3 (g/dL)    Albumin 3.7  3.5 - 5.2 (g/dL)    AST 21  0 - 37 (U/L)    ALT 13  0 - 35 (U/L)    Alkaline Phosphatase 82  39 - 117 (U/L)    Total Bilirubin 0.7  0.3 - 1.2 (mg/dL)    GFR calc non Af Amer 19 (*) >90 (mL/min)    GFR calc Af Amer 22 (*) >90 (mL/min)   LIPASE, BLOOD     Status: Abnormal   Collection Time   08/28/11  6:34 PM      Component Value Range Comment   Lipase 97 (*) 11 - 59 (U/L)   LACTIC ACID, PLASMA     Status: Normal   Collection Time   08/28/11  6:35 PM      Component Value Range Comment   Lactic Acid, Venous 1.4  0.5 - 2.2 (mmol/L)   PROCALCITONIN     Status: Normal   Collection Time   08/28/11  6:35 PM      Component Value Range Comment   Procalcitonin 0.75     POCT I-STAT TROPONIN I     Status: Abnormal   Collection Time   08/28/11  6:57 PM      Component Value Range Comment   Troponin i, poc 0.50 (*) 0.00 - 0.08 (ng/mL)    Comment NOTIFIED PHYSICIAN      Comment 3            URINALYSIS, ROUTINE W REFLEX MICROSCOPIC     Status: Abnormal   Collection Time   08/28/11  7:50 PM      Component Value Range Comment   Color, Urine AMBER (*) YELLOW  BIOCHEMICALS MAY BE AFFECTED BY COLOR   APPearance CLOUDY (*) CLEAR     Specific Gravity, Urine 1.015  1.005 - 1.030     pH 5.0  5.0 - 8.0     Glucose, UA 100 (*) NEGATIVE (mg/dL)    Hgb urine dipstick NEGATIVE  NEGATIVE     Bilirubin Urine SMALL (*) NEGATIVE     Ketones, ur NEGATIVE  NEGATIVE (mg/dL)    Protein, ur 30 (*) NEGATIVE (mg/dL)     Urobilinogen, UA 1.0  0.0 - 1.0 (mg/dL)    Nitrite NEGATIVE  NEGATIVE     Leukocytes, UA TRACE (*) NEGATIVE    URINE MICROSCOPIC-ADD ON     Status: Abnormal   Collection Time   08/28/11  7:50 PM      Component Value Range Comment   Squamous Epithelial / LPF MANY (*) RARE     WBC, UA 0-2  <3 (WBC/hpf)    Bacteria, UA MANY (*) RARE     Casts HYALINE CASTS (*) NEGATIVE    POCT I-STAT 3, BLOOD GAS (G3+)     Status: Abnormal   Collection Time   08/28/11  9:29 PM      Component Value Range Comment   pH, Arterial 7.533 (*) 7.350 - 7.400     pCO2 arterial 60.0 (*) 35.0 - 45.0 (mmHg)    pO2, Arterial 84.0  80.0 - 100.0 (mmHg)    Bicarbonate 50.5 (*) 20.0 - 24.0 (mEq/L)    TCO2 >50  0 - 100 (mmol/L)    O2 Saturation 97.0      Acid-Base Excess 23.0 (*) 0.0 - 2.0 (mmol/L)    Patient temperature 37.0 C      Collection site RADIAL, ALLEN'S TEST ACCEPTABLE      Drawn by RT      Sample type ARTERIAL      Comment NOTIFIED PHYSICIAN       Ct Abdomen Pelvis Wo Contrast  08/28/2011  *RADIOLOGY REPORT*  Clinical Data: The abdominal pain.  Nausea and vomiting.  Green bowel for 1 week.  Distended abdomen.  Right-sided back pain and weakness.  Elevated creatinine.  CT ABDOMEN AND PELVIS WITHOUT CONTRAST  Technique:  Multidetector CT imaging of the abdomen and pelvis was performed following the standard protocol without intravenous contrast.  Comparison: PET CT 10/19/2009  Findings: Focal consolidation in the left lung base posteriorly suggesting pneumonia.  Mild rotation pattern with jejunum on the right side of the abdomen.  The stomach and proximal small bowel are markedly distended with contrast material.  Decompressed ileum. The transition zone appears to be low in the anterior pelvis.  No mass lesion is demonstrated.  Changes may represent adhesion or torsion.  Changes are consistent with high-grade small bowel obstruction.  Calcified granulomas in the spleen.  The liver, gallbladder, and pancreas are  unremarkable.  Calcifications in the abdominal aorta without aneurysm.  Calcifications in the splenic artery.  The adrenal glands and kidneys are unremarkable.  No free fluid or free air in the abdomen.  No evidence of significant bowel wall thickening.  Pelvis:  The bladder wall is not thickened.  No free or loculated pelvic fluid collections.  The colon is mostly decompressed with stool present.  No significant colonic wall thickening or inflammatory change.  Diverticulosis of the sigmoid region.  The appendix is not visualized.  Degenerative changes in the lumbar spine.  Compression of the L1 vertebra.  This is stable since previous chest CT dated 03/20/2011.  IMPRESSION: High-grade small bowel obstruction at the level of the distal jejunum with transition zone noted in the lower anterior pelvis. 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  Changes may represent volvulus.  No evidence of bowel wall thickening, pneumatosis, free fluid, or free air.  Results discussed with Dr. Preston Fleeting at the time of dictation, 2131 hours on 08/28/2011.  Original Report Authenticated By: Marlon Pel, M.D.   Ct Head Wo Contrast  08/28/2011  *RADIOLOGY REPORT*  Clinical Data: The patient fell and hit head 2 weeks ago.  CT HEAD WITHOUT CONTRAST  Technique:  Contiguous axial images were obtained from the base of the skull through the vertex without contrast.  Comparison: 04/22/2008  Findings: Diffuse cerebral atrophy.  Low attenuation changes in the deep white matter consistent small vessel ischemic change.  No mass effect or midline shift.  No abnormal extra-axial fluid collections.  Gray-white matter junctions are distinct.  Basal cisterns are not effaced.  No evidence of acute intracranial hemorrhage.  Vascular calcifications in the intracranial arteries. No depressed skull fractures.  Visualized mastoid air cells are not opacified.  Opacification of the right ethmoid air  cells and right maxillary antrum, progressing since the previous study, most consistent with inflammatory process.  No significant change in the intracranial contents since the previous study.  IMPRESSION: No evidence of acute intracranial hemorrhage, mass lesion, or acute infarct.  Opacification of right ethmoid air cells and maxillary antra.  Original Report Authenticated By: Marlon Pel, M.D.   Dg Chest Port 1 View  08/28/2011  *RADIOLOGY REPORT*  Clinical Data: Shortness of breath.  Low blood pressure.  Lung cancer post therapy.  PORTABLE CHEST - 1 VIEW  Comparison: 02/22/2010 chest x-ray.  03/20/2011 CT.  Findings: Fullness of the right hilum appears slightly more prominent than on the prior chest x-ray.  This may be related to differences in technique although recurrent tumor at this level is not excluded.  Left base infiltrate/atelectasis  new since prior examination.  Central pulmonary vascular prominence without pulmonary edema.  Calcified mildly tortuous aorta.  Remote right humeral neck fracture.  Abnormal small bowel loops along the right hemidiaphragm measuring up to 4 cm.  If there is clinical suspicion for primary abdominal/small bowel abnormality, follow-up imaging whether by plain film or CT recommended.  IMPRESSION: Fullness of the right hilum appears slightly more prominent than on the prior chest x-ray.  This may be related to differences in technique although recurrent tumor at this level is not excluded.  Left base infiltrate/atelectasis new since prior examination.  Abnormal dilated gas-filled small bowel loops.  Results discussed with Dr.Steinl.  Original Report Authenticated By: Fuller Canada, M.D.    Review of Systems  Constitutional: Positive for weight loss.  HENT: Negative.  Negative for hearing loss.   Eyes: Positive for pain (fel and has a periorbital injury).  Respiratory: Positive for cough (acute on chronic, now more wet sounding.) and shortness of breath (iver  the last week, did not seek help). Negative for hemoptysis, sputum production and wheezing.   Gastrointestinal: Positive for vomiting, abdominal pain and constipation (more obstipation). Negative for diarrhea, blood in stool and melena.  Genitourinary: Negative.   Musculoskeletal: Negative.   Skin: Negative.   Neurological: Positive for weakness (generalized). Negative for headaches.  Endo/Heme/Allergies: Negative.    Blood pressure 90/56, pulse 87, temperature 97.8 F (36.6 C), temperature source Oral, resp. rate 19, SpO2 95.00%. Physical Exam  Constitutional: She is oriented to person, place, and time. She appears cachectic. She is active and cooperative. She has a sickly appearance. She appears ill.  HENT:  Head: Head is with contusion (above right eye from fall).    Eyes: Conjunctivae, EOM and lids are normal. Pupils are equal, round, and reactive to light.  Neck: Trachea normal, normal range of motion, full passive range of motion without pain and phonation normal. Neck supple. Normal carotid pulses, no hepatojugular reflux and no JVD present. No tracheal tenderness present. Carotid bruit is not present.  Cardiovascular: Normal rate, regular rhythm, normal heart sounds and normal pulses.   Pulses:      Carotid pulses are 2+ on the right side, and 2+ on the left side.      Radial pulses are 2+ on the right side, and 2+ on the left side.       Femoral pulses are 2+ on the right side, and 2+ on the left side. Respiratory: She has decreased breath sounds in the right upper field. She has no wheezes. She has rhonchi in the right upper field and the right middle field.  GI: She exhibits distension. She exhibits no fluid wave and no mass. Bowel sounds are absent. There is tenderness. There is guarding.  Musculoskeletal: Normal range of motion.  Neurological: She is alert and oriented to person, place, and time. She has normal reflexes.  Skin: Skin is warm and dry. She is not diaphoretic.     Assessment/Plan: I have reviewed the patient's CT scan and confirmed a high grade SBO with a markedly distended stomach.  The does not appear to be any peritonitis.  The patient is comfortable, but having to breath oxygen which she does not have to take at home.  She has a metabolic alkalosis which is partially compensated through PaCO2 retention.  She has severe hyponatremia and hypokalemia.  Eventually within the next 24 hours that patient will need surgery to relieve her bowel obstruction.  Currently in ED getting NGT, to  start empiric antibiotics for sepsis, fluid resuscitation.  Will reassess later.  Tauri Ethington III,Aarthi Uyeno O 08/28/2011, 11:24 PM

## 2011-08-28 NOTE — H&P (Signed)
Name: Lori [name] MRN: [id number] DOB: June 24, 1941    LOS: 0  PCCM ADMISSION NOTE  History of Present Illness: 70 yo WF brought to [facility] ED after 1 week of lower abdominal pain, nausea and vomiting. There was no fever or diarrhea.  She also reports decreased oral intake.  She continued to drink alcohol daily until 2 days ago.  CT scan demonstrated high grade SBO but no free air.  Lines / Drains: 12/10  NGT>>> 12/10  Foley>>>  Cultures: 10/12  BC>>>  Antibiotics: 12/10  Zosyn (intraabdominal sepsis)>>>  Tests / Events: 12/10  Abdominal CT scan: high grade SBO, no free air  Past Medical History  Diagnosis Date  . Lung cancer   . Hypertension    Past Surgical History  Procedure Date  . Abdominal hysterectomy   . Appendectomy    Prior to Admission medications   Medication Sig Start Date End Date Taking? Authorizing Provider  lisinopril (PRINIVIL,ZESTRIL) 10 MG tablet Take 10 mg by mouth daily.     Yes Historical Provider, MD   Allergies No Known Allergies  Family History History reviewed. No pertinent family history.  Social History  reports that she has been smoking.  She does not have any smokeless tobacco history on file. She reports that she drinks alcohol. She reports that she does not use illicit drugs.  Review Of Systems  11 points review of systems is negative with an exception of listed in HPI.  Vital Signs: Temp:  [97.8 F (36.6 C)] 97.8 F (36.6 C) (12/10 1621) Pulse Rate:  [87-118] 87  (12/10 2229) Resp:  [14-34] 19  (12/10 2229) BP: (76-102)/(54-69) 90/56 mmHg (12/10 2229) SpO2:  [88 %-96 %] 95 % (12/10 2229)   Physical Examination: General:  Appears malnourished, chronically ill Neuro:  Somewhat obtunded but wakes up and follows commands   HEENT:  PERRL, no icterus, facial echymoses Neck:  No JVD   Cardiovascular:  RRR, no murmurs Lungs:  Clear to auscultation, bilateral air entry Abdomen:  Tender to palpation without focal tenderness, no focal  peritoneal signs, hyperactive bowel sounds Musculoskeletal:  Moves all extremities to command Skin:  Intact  Ventilator settings:    Labs and Imaging:  Reviewed.  Please refer to the Assessment and Plan section for relevant results.  Assessment and Plan:  Small bowel obstruction with possible peritonitis  Lab 08/28/11 1834  WBC 23.0*  -->NGT to intermittent Sx -->seen by surgery, no indication for emergent surgery, will stabilize first -->empirical Zosym  Hypovolemic hypernatremia secondary to vomiting associated with contraction alkalosis  Lab 08/28/11 1834  NA 119*  -->NS 1000L x 3 then reassess -->trend Na  Acute renal failure  Lab 08/28/11 1834  CREATININE 2.44*  -->IVF as above -->trend K  Hypokalemia  Lab 08/28/11 1834  K 2.5*  -->KCl 10 mEq IV x 6 -->recheck BMP  Metabolic encephalopathy, history of ETOH abuse and high risk of DT -->CIWA -->"banana bag"  Malnutrition, secondary to ETOH and emesis -->check Pre-Albumin, Mg, Phos -->may need TPN rather soon -->monitor for refeeding syndrome  Best practices / Disposition -->ICU status under PCCM, general surgery consulting -->full code -->SCDs for DVT Px -->Protonix for GI Px -->NPO, NGT to intermittent Sx -->family updated at bedside  The patient is critically ill with multiple organ systems failure and requires high complexity decision making for assessment and support, frequent evaluation and titration of therapies, application of advanced monitoring technologies and extensive interpretation of multiple databases. Critical Care Time devoted to patient care  services described in this note is 35 minutes.  Orlean Bradford, M.D. Pulmonary and Critical Care Medicine [facility] Cell: [phone] Pager: [phone]  08/28/2011, 11:29 PM

## 2011-08-28 NOTE — Progress Notes (Signed)
70yo female c/o N/V and abdominal distention, consult with general surgery pending, to begin Zosyn.  Given SCr of 2.44 (baseline <1), will give load of Zosyn 3.375g then begin Zosyn 2.25g IV Q6H and monitor SCr.  Vernard Gambles, PharmD, BCPS 08/28/2011  11:48 PM

## 2011-08-28 NOTE — Progress Notes (Signed)
70 year old female suffered a head injury in a fall 3 weeks ago. Will week ago she started vomiting and has been unable to hold anything down during that time. She's also had abdominal distention. On exam, of old ecchymoses are seen around her eyes. Her lungs have diffuse wheezes. Abdomen is distended with high-pitched tingling bowel sounds consistent with early bowel obstruction. The abdomen is tympanitic and diffusely tender. She does have history of abdominal surgery many years ago. She most likely has a bowel obstruction related to adhesions. She is hypotensive and will need aggressive volume replacement.  Blood pressures normalized with goal-directed fluid replacement. Her electrolytes show severe derangements including severe hypokalemia, hyponatremia, metabolic alkalosis. WBC is markedly elevated at 23,000. CT scan shows definite small bowel obstruction with suggestion that there may be a volvulus. IV contrast could not be given because of renal insufficiency. ABG showed significant CO2 retention, and some of her metabolic alkalosis may be compensatory, and some of it may be due to volume contraction. She shows no evidence of CO2 narcosis. She is a critically ill and consultations have been obtained with general surgery and pulmonary/critical care.  CRITICAL CARE Performed by: Dione Booze   Total critical care time: 120 minutes  Critical care time was exclusive of separately billable procedures and treating other patients.  Critical care was necessary to treat or prevent imminent or life-threatening deterioration.  Critical care was time spent personally by me on the following activities: development of treatment plan with patient and/or surrogate as well as nursing, discussions with consultants, evaluation of patient's response to treatment, examination of patient, obtaining history from patient or surrogate, ordering and performing treatments and interventions, ordering and review of  laboratory studies, ordering and review of radiographic studies, pulse oximetry and re-evaluation of patient's condition.

## 2011-08-28 NOTE — ED Notes (Signed)
Pt hit head 2 weeks ago. Pt has been vomiting since last Monday green bile. Pt stomach is swollen. Pt also difficulty breathing. Rt side back pain and weakness.

## 2011-08-28 NOTE — ED Provider Notes (Signed)
History     CSN: [id number] Arrival date & time: 08/28/2011  4:34 PM   First MD Initiated Contact with Patient 08/28/11 1644      Chief Complaint  Patient presents with  . Emesis    (Consider location/radiation/quality/duration/timing/severity/associated sxs/prior treatment) HPI  Patient presents to ER complaining of a one week hx of acute onset vomiting and lower abdominal pain with very little PO intake over the last week due to vomiting and abdominal pain but denies known fevers or diarrhea. Patient has has remote hx of partial hysterectomy and hx of lung cancer but continues to smoke tobacco. Patient states she is baseline mildly SOB but states increased SOB over the last week but denies CP. Patient notes that two weeks ago she tripped and fell on vacuum cord and hit frontal forehead causing bruising on face since then but denies LOC and states that the week after hitting head she had no complaints other than mild soreness until acute onset vomiting and abdominal pain a weeks ago. Patient also complaining of a 4 day hx of gradual onset right lower back pain noting a hx of chronic back pain. Patient denies dysuria or hematuria. Patient is also complaining of gradual onset abdominal distention. Pain and vomiting aggravated by eating and movement. Denies alleviating factors.      Past Medical History  Diagnosis Date  . Lung cancer   . Hypertension     Past Surgical History  Procedure Date  . Abdominal hysterectomy   . Appendectomy     History reviewed. No pertinent family history.  History  Substance Use Topics  . Smoking status: Current Everyday Smoker  . Smokeless tobacco: Not on file  . Alcohol Use: Yes    OB History    Grav Para Term Preterm Abortions TAB SAB Ect Mult Living                  Review of Systems  All other systems reviewed and are negative.    Allergies  Review of patient's allergies indicates no known allergies.  Home Medications   Current  Outpatient Rx  Name Route Sig Dispense Refill  . LISINOPRIL 10 MG PO TABS Oral Take 10 mg by mouth daily.        BP 76/54  Pulse 118  Temp(Src) 97.8 F (36.6 C) (Oral)  SpO2 88%  Physical Exam  Nursing note and vitals reviewed. Constitutional: She is oriented to person, place, and time. She appears well-developed. No distress.       Thin appearing  HENT:  Head: Normocephalic and atraumatic.  Eyes: Conjunctivae and EOM are normal. Pupils are equal, round, and reactive to light.  Neck: Normal range of motion. Neck supple.  Cardiovascular: Regular rhythm, S1 normal, S2 normal, normal heart sounds and intact distal pulses.  Tachycardia present.  Exam reveals no gallop and no friction rub.   No murmur heard. Pulmonary/Chest: Tachypnea noted. No respiratory distress. She has no wheezes. She has rhonchi in the right lower field and the left lower field. She has no rales. She exhibits tenderness. She exhibits no mass and no crepitus.  Abdominal: Soft. She exhibits distension. She exhibits no mass. Bowel sounds are increased. There is generalized tenderness. There is no rigidity, no rebound and no guarding.       Tympanitic lower distended abdomen. Diffusely tender.   Musculoskeletal: Normal range of motion. She exhibits no edema and no tenderness.       Lumbar back: She exhibits tenderness.  TTP of right lower back is soft tissue, paraspinal region. No skin changes  Neurological: She is alert and oriented to person, place, and time.  Skin: Skin is warm and dry. No rash noted. She is not diaphoretic. No erythema.  Psychiatric: She has a normal mood and affect.    ED Course  Procedures (including critical care time)  IV fluids, IV fentanyl and zofran, albuterol/atrovent neb  Labs Reviewed  CBC - Abnormal; Notable for the following:    WBC 23.0 (*)    Hemoglobin 15.1 (*)    MCH 35.9 (*)    MCHC 36.8 (*)    All other components within normal limits  DIFFERENTIAL - Abnormal;  Notable for the following:    Neutro Abs 17.2 (*)    Lymphocytes Relative 4 (*)    Monocytes Relative 21 (*)    Monocytes Absolute 4.9 (*)    All other components within normal limits  COMPREHENSIVE METABOLIC PANEL - Abnormal; Notable for the following:    Sodium 119 (*)    Potassium 2.5 (*)    Chloride <65 (*)    CO2 47 (*)    Glucose, Bld 121 (*)    BUN 79 (*)    Creatinine, Ser 2.44 (*)    GFR calc non Af Amer 19 (*)    GFR calc Af Amer 22 (*)    All other components within normal limits  LIPASE, BLOOD - Abnormal; Notable for the following:    Lipase 97 (*)    All other components within normal limits  URINALYSIS, ROUTINE W REFLEX MICROSCOPIC - Abnormal; Notable for the following:    Color, Urine AMBER (*) BIOCHEMICALS MAY BE AFFECTED BY COLOR   APPearance CLOUDY (*)    Glucose, UA 100 (*)    Bilirubin Urine SMALL (*)    Protein, ur 30 (*)    Leukocytes, UA TRACE (*)    All other components within normal limits  GLUCOSE, CAPILLARY - Abnormal; Notable for the following:    Glucose-Capillary 154 (*)    All other components within normal limits  POCT I-STAT TROPONIN I - Abnormal; Notable for the following:    Troponin i, poc 0.50 (*)    All other components within normal limits  URINE MICROSCOPIC-ADD ON - Abnormal; Notable for the following:    Squamous Epithelial / LPF MANY (*)    Bacteria, UA MANY (*)    Casts HYALINE CASTS (*)    All other components within normal limits  POCT I-STAT 3, BLOOD GAS (G3+) - Abnormal; Notable for the following:    pH, Arterial 7.533 (*)    pCO2 arterial 60.0 (*)    Bicarbonate 50.5 (*)    Acid-Base Excess 23.0 (*)    All other components within normal limits  LACTIC ACID, PLASMA  PROCALCITONIN  I-STAT TROPONIN I  CULTURE, BLOOD (ROUTINE X 2)  CULTURE, BLOOD (ROUTINE X 2)  POCT CBG MONITORING  I-STAT 3, BLOOD GAS (G3+)   Dg Chest Port 1 View  08/28/2011  *RADIOLOGY REPORT*  Clinical Data: Shortness of breath.  Low blood pressure.   Lung cancer post therapy.  PORTABLE CHEST - 1 VIEW  Comparison: 02/22/2010 chest x-ray.  03/20/2011 CT.  Findings: Fullness of the right hilum appears slightly more prominent than on the prior chest x-ray.  This may be related to differences in technique although recurrent tumor at this level is not excluded.  Left base infiltrate/atelectasis new since prior examination.  Central pulmonary vascular prominence without pulmonary  edema.  Calcified mildly tortuous aorta.  Remote right humeral neck fracture.  Abnormal small bowel loops along the right hemidiaphragm measuring up to 4 cm.  If there is clinical suspicion for primary abdominal/small bowel abnormality, follow-up imaging whether by plain film or CT recommended.  IMPRESSION: Fullness of the right hilum appears slightly more prominent than on the prior chest x-ray.  This may be related to differences in technique although recurrent tumor at this level is not excluded.  Left base infiltrate/atelectasis new since prior examination.  Abnormal dilated gas-filled small bowel loops.  Results discussed with Dr.Steinl.  Original Report Authenticated By: Fuller Canada, M.D.     No diagnosis found.   Date: 08/28/2011  Rate: 117  Rhythm: sinus tachycardia  QRS Axis: normal  Intervals: QT prolonged  ST/T Wave abnormalities: nonspecific T wave changes  Conduction Disutrbances:none  Narrative Interpretation:   Old EKG Reviewed: no significant changes from February 22, 2010   10:06 PM Dr. Lindie Spruce, general surgery to see patient. Will speak with Hospitalist due to metabolic abnormalities   MDM  PCCM will admit patient. VS remaining stable with fluid rescucitation.         Jenness Corner, PA 08/28/11 2240

## 2011-08-29 ENCOUNTER — Inpatient Hospital Stay (HOSPITAL_COMMUNITY): Payer: Medicare Other

## 2011-08-29 ENCOUNTER — Encounter (HOSPITAL_COMMUNITY): Payer: Self-pay | Admitting: *Deleted

## 2011-08-29 DIAGNOSIS — E876 Hypokalemia: Secondary | ICD-10-CM

## 2011-08-29 DIAGNOSIS — N179 Acute kidney failure, unspecified: Secondary | ICD-10-CM

## 2011-08-29 DIAGNOSIS — K565 Intestinal adhesions [bands], unspecified as to partial versus complete obstruction: Secondary | ICD-10-CM

## 2011-08-29 DIAGNOSIS — E871 Hypo-osmolality and hyponatremia: Secondary | ICD-10-CM

## 2011-08-29 LAB — HEPATIC FUNCTION PANEL
AST: 24 U/L (ref 0–37)
Albumin: 3.5 g/dL (ref 3.5–5.2)
Alkaline Phosphatase: 82 U/L (ref 39–117)
Total Bilirubin: 0.6 mg/dL (ref 0.3–1.2)
Total Protein: 6.6 g/dL (ref 6.0–8.3)

## 2011-08-29 LAB — BASIC METABOLIC PANEL
BUN: 37 mg/dL — ABNORMAL HIGH (ref 6–23)
CO2: 29 mEq/L (ref 19–32)
Calcium: 7.8 mg/dL — ABNORMAL LOW (ref 8.4–10.5)
Calcium: 6 mg/dL — CL (ref 8.4–10.5)
Chloride: 97 mEq/L (ref 96–112)
Creatinine, Ser: 1.24 mg/dL — ABNORMAL HIGH (ref 0.50–1.10)
GFR calc Af Amer: 50 mL/min — ABNORMAL LOW (ref >90)
GFR calc non Af Amer: 24 mL/min — ABNORMAL LOW (ref >90)
GFR calc non Af Amer: 43 mL/min — ABNORMAL LOW (ref >90)
Glucose, Bld: 87 mg/dL (ref 70–99)
Glucose, Bld: 69 mg/dL — ABNORMAL LOW (ref 70–99)
Potassium: 2.5 mEq/L — CL (ref 3.5–5.1)
Sodium: 124 mEq/L — ABNORMAL LOW (ref 135–145)
Sodium: 133 mEq/L — ABNORMAL LOW (ref 135–145)

## 2011-08-29 LAB — BLOOD GAS, ARTERIAL
Drawn by: 27022
O2 Content: 2 L/min
O2 Saturation: 95.6 %
Patient temperature: 98.7
pO2, Arterial: 79.2 mmHg — ABNORMAL LOW (ref 80.0–100.0)

## 2011-08-29 LAB — CBC
Hemoglobin: 12.2 g/dL (ref 12.0–15.0)
MCH: 34.5 pg — ABNORMAL HIGH (ref 26.0–34.0)
MCHC: 34.9 g/dL (ref 30.0–36.0)
Platelets: 209 10*3/uL (ref 150–400)
RDW: 13.4 % (ref 11.5–15.5)

## 2011-08-29 LAB — PHOSPHORUS
Phosphorus: 4.4 mg/dL (ref 2.3–4.6)
Phosphorus: 3.6 mg/dL (ref 2.3–4.6)

## 2011-08-29 LAB — GLUCOSE, CAPILLARY

## 2011-08-29 LAB — MRSA PCR SCREENING: MRSA by PCR: NEGATIVE

## 2011-08-29 MED ORDER — FOLIC ACID 5 MG/ML IJ SOLN
1.0000 mg | Freq: Every day | INTRAMUSCULAR | Status: DC
  Administered 2011-08-29 – 2011-09-04 (×7): 1 mg via INTRAVENOUS
  Filled 2011-08-29 (×10): qty 0.2

## 2011-08-29 MED ORDER — SODIUM CHLORIDE 0.9 % IV SOLN
INTRAVENOUS | Status: DC
  Administered 2011-08-30: 20 mL/h via INTRAVENOUS
  Administered 2011-09-05: 500 mL via INTRAVENOUS

## 2011-08-29 MED ORDER — IPRATROPIUM BROMIDE 0.02 % IN SOLN: 0.5000 mg | RESPIRATORY_TRACT | Status: DC | PRN

## 2011-08-29 MED ORDER — POTASSIUM CHLORIDE 10 MEQ/100ML IV SOLN
10.0000 meq | INTRAVENOUS | Status: AC
  Administered 2011-08-29 (×6): 10 meq via INTRAVENOUS
  Filled 2011-08-29: qty 100
  Filled 2011-08-29: qty 500

## 2011-08-29 MED ORDER — THIAMINE HCL 100 MG/ML IJ SOLN
100.0000 mg | Freq: Every day | INTRAMUSCULAR | Status: DC
  Administered 2011-08-29 – 2011-09-01 (×4): 100 mg via INTRAVENOUS
  Administered 2011-09-02: 11:00:00 via INTRAVENOUS
  Administered 2011-09-03: 100 mg via INTRAVENOUS
  Administered 2011-09-04: 11:00:00 via INTRAVENOUS
  Administered 2011-09-05: 100 mg via INTRAVENOUS
  Filled 2011-08-29 (×8): qty 1

## 2011-08-29 MED ORDER — PIPERACILLIN-TAZOBACTAM IN DEX 2-0.25 GM/50ML IV SOLN
2.2500 g | Freq: Four times a day (QID) | INTRAVENOUS | Status: DC
  Administered 2011-08-29 – 2011-08-30 (×5): 2.25 g via INTRAVENOUS
  Filled 2011-08-29 (×7): qty 50

## 2011-08-29 MED ORDER — INFLUENZA VIRUS VACC SPLIT PF IM SUSP
0.5000 mL | INTRAMUSCULAR | Status: AC
  Administered 2011-08-30: 0.5 mL via INTRAMUSCULAR
  Filled 2011-08-29: qty 0.5

## 2011-08-29 MED ORDER — SODIUM CHLORIDE 0.9 % IV SOLN
INTRAVENOUS | Status: DC
  Administered 2011-08-29: 10 mL/h via INTRAVENOUS

## 2011-08-29 MED ORDER — POTASSIUM CHLORIDE 10 MEQ/100ML IV SOLN
10.0000 meq | INTRAVENOUS | Status: AC
  Administered 2011-08-29 (×6): 10 meq via INTRAVENOUS
  Filled 2011-08-29: qty 100
  Filled 2011-08-29: qty 600

## 2011-08-29 MED ORDER — SODIUM CHLORIDE 0.9 % IV SOLN: INTRAVENOUS | Status: DC

## 2011-08-29 MED ORDER — SODIUM CHLORIDE 0.9 % IV SOLN
750.0000 mL | INTRAVENOUS | Status: DC | PRN
  Administered 2011-08-29 (×4): 750 mL via INTRAVENOUS

## 2011-08-29 MED ORDER — MAGNESIUM SULFATE 40 MG/ML IJ SOLN
2.0000 g | Freq: Once | INTRAMUSCULAR | Status: AC
  Administered 2011-08-29: 2 g via INTRAVENOUS
  Filled 2011-08-29: qty 50

## 2011-08-29 MED ORDER — BIOTENE DRY MOUTH MT LIQD
15.0000 mL | Freq: Two times a day (BID) | OROMUCOSAL | Status: DC
  Administered 2011-08-29 – 2011-09-04 (×13): 15 mL via OROMUCOSAL

## 2011-08-29 MED ORDER — POTASSIUM CHLORIDE IN NACL 40-0.9 MEQ/L-% IV SOLN
INTRAVENOUS | Status: DC
  Administered 2011-08-29 – 2011-09-05 (×13): via INTRAVENOUS
  Filled 2011-08-29 (×21): qty 1000

## 2011-08-29 MED ORDER — CHLORHEXIDINE GLUCONATE 0.12 % MT SOLN
15.0000 mL | Freq: Two times a day (BID) | OROMUCOSAL | Status: DC
  Administered 2011-08-29 – 2011-09-04 (×12): 15 mL via OROMUCOSAL
  Filled 2011-08-29 (×17): qty 15

## 2011-08-29 MED ORDER — ALBUTEROL SULFATE (5 MG/ML) 0.5% IN NEBU: 2.5000 mg | INHALATION_SOLUTION | RESPIRATORY_TRACT | Status: DC | PRN

## 2011-08-29 NOTE — ED Notes (Signed)
Pt reports that the new medication had helped "much better and denies nausea after pain medication."

## 2011-08-29 NOTE — Procedures (Signed)
Central Venous Catheter Insertion Procedure Note Lori [name] [id number] November 03, 1940  Procedure: Insertion of Central Venous Catheter Indications: Drug and/or fluid administration  Procedure Details Consent: Risks of procedure as well as the alternatives and risks of each were explained to the (patient/caregiver).  Consent for procedure obtained. Time Out: Verified patient identification, verified procedure, site/side was marked, verified correct patient position, special equipment/implants available, medications/allergies/relevent history reviewed, required imaging and test results available.  Performed  Maximum sterile technique was used including antiseptics. Skin prep: Chlorhexidine; local anesthetic administered A antimicrobial bonded/coated triple lumen catheter was placed in the left subclavian vein using the Seldinger technique.  Evaluation Blood flow good Complications: No apparent complications Patient did tolerate procedure well. Chest X-ray ordered to verify placement.  CXR: Central venous catheter in good position w/o pneumothorax.  Lori [name] 08/29/2011, 9:00 AM  I was present for procedure.  Lori [name] 08/29/2011, 10:05 AM Pager:  [phone]

## 2011-08-29 NOTE — Progress Notes (Signed)
INITIAL ADULT NUTRITION ASSESSMENT Date: 08/29/2011   Time: 3:58 PM  Reason for Assessment: Nutrition Risk Report "unintentional wt loss"  ASSESSMENT: Female 70 y.o.  Dx: abdominal pain, decreased PO intake  Hx:  Past Medical History  Diagnosis Date  . Hypertension   . Shortness of breath 08/28/11    "all the time this past week"  . Chronic back pain greater than 3 months duration   . GERD (gastroesophageal reflux disease)     "when she was doing chemo"  . Lung cancer dx'd 04/2008    S/P chemo, radiation   Related Meds:     . sodium chloride   Intravenous Once  . albuterol  2.5 mg Nebulization Once  . antiseptic oral rinse  15 mL Mouth Rinse q12n4p  . chlorhexidine  15 mL Mouth Rinse BID  . fentaNYL  50 mcg Intravenous Once  . folic acid  1 mg Intravenous Daily  . HYDROmorphone      .  HYDROmorphone (DILAUDID) injection  0.5 mg Intravenous Once  . influenza  inactive virus vaccine  0.5 mL Intramuscular Tomorrow-1000  . ipratropium  0.5 mg Nebulization Once  . magnesium sulfate 1 - 4 g bolus IVPB  2 g Intravenous Once  . ondansetron  4 mg Intravenous Once  . ondansetron  4 mg Intravenous Once  . pantoprazole (PROTONIX) IV  40 mg Intravenous Q24H  . piperacillin-tazobactam (ZOSYN)  IV  2.25 g Intravenous Q6H  . piperacillin-tazobactam (ZOSYN)  IV  3.375 g Intravenous NOW  . potassium chloride  10 mEq Intravenous Once  . potassium chloride  10 mEq Intravenous Q1 Hr x 6  . potassium chloride  10 mEq Intravenous Q1 Hr x 6  . sodium chloride  1,000 mL Intravenous Once  . sodium chloride  1,000 mL Intravenous Q1 Hr x 3  . thiamine  100 mg Intravenous Daily  . DISCONTD: fentaNYL  50 mcg Intravenous Once  . DISCONTD: piperacillin-tazobactam (ZOSYN)  IV  3.375 g Intravenous Once  . DISCONTD: vancomycin  1,000 mg Intravenous Once   Ht: 5\' 6"  (167.6 cm)  Wt: 117 lb 8.1 oz (53.3 kg)  Ideal Wt: 59.1 kg % Ideal Wt: 90%  Usual Wt: 126 lb (57.3 kg), 1 month ago % Usual Wt:  93%  Body mass index is 18.97 kg/(m^2). Wt is WNL.  Food/Nutrition Related Hx: wt loss with x 1 month  Labs:  CMP  CMP     Component Value Date/Time   NA 133* 08/29/2011 1507   NA 136 03/20/2011 0805   K 2.5* 08/29/2011 1507   K 4.3 03/20/2011 0805   CL 98 08/29/2011 1507   CL 88* 03/20/2011 0805   CO2 30 08/29/2011 1507   CO2 31 03/20/2011 0805   GLUCOSE 69* 08/29/2011 1507   GLUCOSE 123* 03/20/2011 0805   BUN 37* 08/29/2011 1507   BUN 8 03/20/2011 0805   CREATININE 1.24* 08/29/2011 1507   CREATININE 0.6 03/20/2011 0805   CALCIUM 6.0* 08/29/2011 1507   CALCIUM 9.4 03/20/2011 0805   PROT 6.6 08/28/2011 2336   PROT 7.9 03/20/2011 0805   ALBUMIN 3.5 08/28/2011 2336   AST 24 08/28/2011 2336   AST 69* 03/20/2011 0805   ALT 13 08/28/2011 2336   ALKPHOS 82 08/28/2011 2336   ALKPHOS 96* 03/20/2011 0805   BILITOT 0.6 08/28/2011 2336   BILITOT 1.20 03/20/2011 0805   GFRNONAA 43* 08/29/2011 1507   GFRAA 50* 08/29/2011 1507  Magnesium 1.9 Phosphorus 3.6  Intake/Output: I/O  last 3 completed shifts: In: 5195.5 [P.O.:240; I.V.:4305.5; IV Piggyback:650] Out: 5850 [Urine:1950; Emesis/NG output:3900] Total I/O In: 1520 [I.V.:1520] Out: 2850 [Urine:2000; Emesis/NG output:850]  Diet Order: NPO  Supplements/Tube Feeding: none  IVF:    0.9 % NaCl with KCl 40 mEq / L Last Rate: 125 mL/hr at 08/29/11 1500  DISCONTD: sodium chloride Last Rate: 10 mL/hr (08/29/11 0527)  DISCONTD: sodium chloride   DISCONTD: banana bag IV fluid 1000 mL Last Rate: 150 mL/hr at 08/29/11 0516   Estimated Nutritional Needs:   Kcal:  1400 - 1600 kcal Protein:  70 - 85 g  Fluid:  1.4 - 1.6 L/d  Per family, pt was only consuming "shakes made with whole foods and almond milk" PTA for 3-4 days. Pt was only able to tolerate up to 2 cups of the shake daily. Unsure of kcal/protein content of shakes. Suspect shake content suboptimal in terms of protein and kcal.  Pt with 7% wt loss x 1 month. Meets criteria for severe  malnutrition in the context of acute illness. Pt is at risk for malnutrition given chronic illness, EtOH use, and poor PO intake PTA.  NUTRITION DIAGNOSIS: -Inadequate oral intake (NI-2.1).  Status: Ongoing  RELATED TO: inability to eat  AS EVIDENCE BY: NPO status  MONITORING/EVALUATION(Goals): Goal: Advance to diet as able Monitor: Weights, labs, PO intake  EDUCATION NEEDS: -Education not appropriate at this time  INTERVENTION: 1. Advance to diet as medically able. If unable to advance diet, consider nutrition support to prevent further malnutrition. 2. Pt is at risk for refeeding. Monitor potassium, magnesium, and phosphorus. Replete prn.  Dietitian #: [phone]  DOCUMENTATION CODES Per approved criteria  -Severe malnutrition in the context of acute illness or injury    Adair Laundry 08/29/2011, 3:58 PM

## 2011-08-29 NOTE — Progress Notes (Signed)
CRITICAL VALUE ALERT  Critical value received:  k 2.1  Date of notification:  08/29/11  Time of notification:  0630  Critical value read back:yes  Nurse who received alert:  Madalyn Rob   MD notified (1st page):  Dr. Tyson Alias  Time of first page:  0630  MD notified (2nd page):  Time of second page:  Responding MD: Dr. Tyson Alias   Time MD responded: 0630

## 2011-08-29 NOTE — H&P (Deleted)
Name: Lori [name] MRN: [id number] DOB: 03-27-1941    LOS: 1  PCCM ADMISSION NOTE  History of Present Illness: 70 yo WF brought to [facility] ED after 1 week of lower abdominal pain, nausea and vomiting. There was no fever or diarrhea.  She also reports decreased oral intake.  She continued to drink alcohol daily until 2 days ago.  CT scan demonstrated high grade SBO but no free air.  Lines / Drains: 12/10  NGT>>> 12/10  Foley>>> 12/11: left [place] cvl>>>  Cultures: 10/12  BC>>>  Antibiotics: 12/10  Zosyn (intraabdominal sepsis)>>>  Tests / Events: 12/10  Abdominal CT scan: high grade SBO, no free air    Vital Signs: Temp:  [97.8 F (36.6 C)-98.7 F (37.1 C)] 98.7 F (37.1 C) (12/11 0800) Pulse Rate:  [85-118] 89  (12/11 0700) Resp:  [14-34] 21  (12/11 0700) BP: (74-110)/(35-69) 87/45 mmHg (12/11 0700) SpO2:  [61 %-100 %] 99 % (12/11 0700) Weight:  [53.3 kg (117 lb 8.1 oz)] 117 lb 8.1 oz (53.3 kg) (12/11 0425) I/O last 3 completed shifts: In: 5195.5 [P.O.:240; I.V.:4305.5; IV Piggyback:650] Out: 5850 [Urine:1950; Emesis/NG output:3900] Physical Examination: General:  Appears malnourished, chronically ill Neuro:  Confused at times.  HEENT:  PERRL, no icterus, facial echymoses Neck:  No JVD   Cardiovascular:  RRR, no murmurs Lungs: bilateral air entry, with prolonged exp wheeze Abdomen:  Tender to palpation without focal tenderness, no focal peritoneal signs, hyperactive bowel sounds Musculoskeletal:  Moves all extremities to command Skin:  Intact    Labs and Imaging:  Reviewed.  Please refer to the Assessment and Plan section for relevant results.  Assessment and Plan:  Small bowel obstruction with possible peritonitis. General surgery planning OR for 12/12  Lab 08/29/11 0500 08/28/11 1834  WBC 17.5* 23.0*  -NGT to intermittent Sx -OR on 12/12 -empirical Zosyn  Hypovolemic hypernatremia secondary to vomiting associated with contraction alkalosis: improved w/ NS  resuscitation efforts.   Lab 08/29/11 0500 08/28/11 1834  NA 124* 119*  -cont NS  -trend Na  Acute renal failure: improved w/ IVFs  Lab 08/29/11 0500 08/28/11 1834  CREATININE 2.03* 2.44*  -IVF as above -strict I&O  Hypokalemia: still hypokalemic after 6 runs of KCL  Lab 08/29/11 0500 08/28/11 1834  K 2.1* 2.5*  -KCl 10 mEq IV x 6, and recheck  Metabolic encephalopathy, history of ETOH abuse and high risk of DT -cont folate and thiamine IV -close obs in ICU. If develops confusion will start precedex gtt  Malnutrition, secondary to ETOH and emesis - f/u  Pre-Albumin -may need TPN rather soon -monitor for refeeding syndrome  Best practices / Disposition -->ICU status under PCCM, general surgery consulting -->full code -->SCDs for DVT Px -->Protonix for GI Px -->NPO, NGT to intermittent Sx -->family updated at bedside  The patient is critically ill with multiple organ systems failure and requires high complexity decision making for assessment and support, frequent evaluation and titration of therapies, application of advanced monitoring technologies and extensive interpretation of multiple databases. Critical Care Time devoted to patient care services described in this note is 35 minutes. Anders Simmonds ACNP-BC [facility] Pulmonary/Critical Care Pager # [phone] OR # [phone] if no answer  08/29/2011, 9:11 AM

## 2011-08-29 NOTE — ED Notes (Signed)
Upon insertion, NG tube had immediate return of 800 cc of brown/green malodorous fluid that was gravity fed prior to being hooked to suction. Abdomen had not been grossly distended but fluid was rapidly free flowing.

## 2011-08-29 NOTE — Progress Notes (Signed)
eLink Physician-Brief Progress Note Patient Name: Lori [name] DOB: 20-Sep-1940 MRN: [id number]  Date of Service  08/29/2011   HPI/Events of Note     eICU Interventions  Hypokalemia -repleted    Intervention Category Intermediate Interventions: Electrolyte abnormality - evaluation and management  [name] V. 08/29/2011, 4:39 PM

## 2011-08-29 NOTE — ED Notes (Signed)
Spoke with Victorino Dike from the lab who called to report critical values for this pt as follows:  Sodium= 119 Potassium= 2.5 Chloride =<65 CO2 =47 Dr. Preston Fleeting notified and new orders received.

## 2011-08-29 NOTE — Progress Notes (Signed)
Name: Lori [name] MRN: [id number] DOB: 1941/01/28    LOS: 1  PCCM Progress NOTE  History of Present Illness: 70 yo WF smoker brought to [facility] ED 08/28/2011 after 1 week of lower abdominal pain, nausea and vomiting. There was no fever or diarrhea.  She also reports decreased oral intake.  She continued to drink alcohol daily until 2 days ago.  CT scan demonstrated high grade SBO but no free air. PMHx lung cancer, HTN, ETOH  Lines / Drains: 12/10  NGT>>> 12/10  Foley>>> 12/11: left [place] cvl>>>  Cultures: 10/12  BC>>>  Antibiotics: 12/10  Zosyn (intraabdominal sepsis)>>>  Tests / Events: 12/10  Abdominal CT scan: high grade SBO, no free air  Vital Signs: Temp:  [97.8 F (36.6 C)-98.7 F (37.1 C)] 98.7 F (37.1 C) (12/11 0800) Pulse Rate:  [85-118] 89  (12/11 0700) Resp:  [14-34] 21  (12/11 0700) BP: (74-110)/(35-69) 87/45 mmHg (12/11 0700) SpO2:  [61 %-100 %] 99 % (12/11 0700) Weight:  [53.3 kg (117 lb 8.1 oz)] 117 lb 8.1 oz (53.3 kg) (12/11 0425) I/O last 3 completed shifts: In: 5195.5 [P.O.:240; I.V.:4305.5; IV Piggyback:650] Out: 5850 [Urine:1950; Emesis/NG output:3900] Physical Examination: General:  Appears malnourished, chronically ill Neuro:  Confused at times.  HEENT:  PERRL, no icterus, facial echymoses Neck:  No JVD   Cardiovascular:  RRR, no murmurs Lungs: bilateral air entry, with prolonged exp wheeze Abdomen:  Tender to palpation without focal tenderness, no focal peritoneal signs, hyperactive bowel sounds Musculoskeletal:  Moves all extremities to command Skin:  Intact  Lab Results  Component Value Date   WBC 17.5* 08/29/2011   HGB 12.2 08/29/2011   HCT 35.0* 08/29/2011   MCV 98.9 08/29/2011   PLT 209 08/29/2011   Lab Results  Component Value Date   CREATININE 2.03* 08/29/2011   BUN 69* 08/29/2011   NA 124* 08/29/2011   K 2.1* 08/29/2011   CL 77* 08/29/2011   CO2 39* 08/29/2011   Ct Abdomen Pelvis Wo Contrast  08/28/2011  *RADIOLOGY REPORT*   Clinical Data: The abdominal pain.  Nausea and vomiting.  Green bowel for 1 week.  Distended abdomen.  Right-sided back pain and weakness.  Elevated creatinine.  CT ABDOMEN AND PELVIS WITHOUT CONTRAST  Technique:  Multidetector CT imaging of the abdomen and pelvis was performed following the standard protocol without intravenous contrast.  Comparison: PET CT 10/19/2009  Findings: Focal consolidation in the left lung base posteriorly suggesting pneumonia.  Mild rotation pattern with jejunum on the right side of the abdomen.  The stomach and proximal small bowel are markedly distended with contrast material.  Decompressed ileum. The transition zone appears to be low in the anterior pelvis.  No mass lesion is demonstrated.  Changes may represent adhesion or torsion.  Changes are consistent with high-grade small bowel obstruction.  Calcified granulomas in the spleen.  The liver, gallbladder, and pancreas are unremarkable.  Calcifications in the abdominal aorta without aneurysm.  Calcifications in the splenic artery.  The adrenal glands and kidneys are unremarkable.  No free fluid or free air in the abdomen.  No evidence of significant bowel wall thickening.  Pelvis:  The bladder wall is not thickened.  No free or loculated pelvic fluid collections.  The colon is mostly decompressed with stool present.  No significant colonic wall thickening or inflammatory change.  Diverticulosis of the sigmoid region.  The appendix is not visualized.  Degenerative changes in the lumbar spine.  Compression of the L1 vertebra.  This is  stable since previous chest CT dated 03/20/2011.  IMPRESSION: High-grade small bowel obstruction at the level of the distal jejunum with transition zone noted in the lower anterior pelvis. 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  Changes may represent volvulus.  No evidence of bowel wall thickening, pneumatosis, free fluid, or free air.  Results  discussed with Dr. Preston Fleeting at the time of dictation, 2131 hours on 08/28/2011.  Original Report Authenticated By: Marlon Pel, M.D.   Ct Head Wo Contrast  08/28/2011  *RADIOLOGY REPORT*  Clinical Data: The patient fell and hit head 2 weeks ago.  CT HEAD WITHOUT CONTRAST  Technique:  Contiguous axial images were obtained from the base of the skull through the vertex without contrast.  Comparison: 04/22/2008  Findings: Diffuse cerebral atrophy.  Low attenuation changes in the deep white matter consistent small vessel ischemic change.  No mass effect or midline shift.  No abnormal extra-axial fluid collections.  Gray-white matter junctions are distinct.  Basal cisterns are not effaced.  No evidence of acute intracranial hemorrhage.  Vascular calcifications in the intracranial arteries. No depressed skull fractures.  Visualized mastoid air cells are not opacified.  Opacification of the right ethmoid air cells and right maxillary antrum, progressing since the previous study, most consistent with inflammatory process.  No significant change in the intracranial contents since the previous study.  IMPRESSION: No evidence of acute intracranial hemorrhage, mass lesion, or acute infarct.  Opacification of right ethmoid air cells and maxillary antra.  Original Report Authenticated By: Marlon Pel, M.D.   Dg Chest Port 1 View  08/29/2011  *RADIOLOGY REPORT*  Clinical Data: Central line placement.  PORTABLE CHEST - 1 VIEW  Comparison: Chest x-ray 08/28/2011.  Findings: The left subclavian central venous catheter tip is in the mid SVC.  No complicating features.  The NG tube is coursing down the esophagus and into the stomach.  The cardiac silhouette, mediastinal and hilar contours are stable.  Stable surgical and radiation changes involving the right hilum and stable left basilar scarring.  IMPRESSION: Left subclavian center venous catheter in good position.  The tip is in the mid SVC.  No complicating  features.  Original Report Authenticated By: P. Loralie Champagne, M.D.   Dg Chest Port 1 View  08/28/2011  *RADIOLOGY REPORT*  Clinical Data: Shortness of breath.  Low blood pressure.  Lung cancer post therapy.  PORTABLE CHEST - 1 VIEW  Comparison: 02/22/2010 chest x-ray.  03/20/2011 CT.  Findings: Fullness of the right hilum appears slightly more prominent than on the prior chest x-ray.  This may be related to differences in technique although recurrent tumor at this level is not excluded.  Left base infiltrate/atelectasis new since prior examination.  Central pulmonary vascular prominence without pulmonary edema.  Calcified mildly tortuous aorta.  Remote right humeral neck fracture.  Abnormal small bowel loops along the right hemidiaphragm measuring up to 4 cm.  If there is clinical suspicion for primary abdominal/small bowel abnormality, follow-up imaging whether by plain film or CT recommended.  IMPRESSION: Fullness of the right hilum appears slightly more prominent than on the prior chest x-ray.  This may be related to differences in technique although recurrent tumor at this level is not excluded.  Left base infiltrate/atelectasis new since prior examination.  Abnormal dilated gas-filled small bowel loops.  Results discussed with Dr.Steinl.  Original Report Authenticated By: Fuller [place], M.D.   Assessment and Plan:  Small  bowel obstruction with possible peritonitis. General surgery planning OR for 12/12  Lab 08/29/11 0500 08/28/11 1834  WBC 17.5* 23.0*  -NGT to intermittent Sx -tentative plan for OR on 12/12 -D2/x Zosyn  Hypovolemic hypernatremia secondary to vomiting associated with contraction alkalosis: improved w/ NS resuscitation efforts.   Lab 08/29/11 0500 08/28/11 1834  NA 124* 119*  -continue NS IV fluid -trend Na -check cortisol, TSH  Hypotension: prob a mix of narcotic effect and hypovolemia Plan: -transduce CVP to assist with volume resuscitation efforts.   Acute renal  failure: improved w/ IVFs  Lab 08/29/11 0500 08/28/11 1834  CREATININE 2.03* 2.44*  -IVF as above -strict I&O  Hypokalemia: still hypokalemic after 6 runs of KCL  Lab 08/29/11 0500 08/28/11 1834  K 2.1* 2.5*  -KCl 10 mEq IV x 6, and recheck  Metabolic encephalopathy, history of ETOH abuse and high risk of DT -continue folate and thiamine IV -close obs in ICU. If develops confusion will start precedex gtt  Malnutrition, secondary to ETOH and emesis - f/u  Pre-Albumin -may need TPN rather soon -monitor for refeeding syndrome  Perihilar fullness on CXR with hx of lung cancer, tobacco abuse -will need repeat CT chest to further evaluate when more stable  Best practices / Disposition -->ICU status under PCCM, general surgery consulting -->full code -->SCDs for DVT Px -->Protonix for GI Px -->NPO, NGT to intermittent Sx  The patient is critically ill with multiple organ systems failure and requires high complexity decision making for assessment and support, frequent evaluation and titration of therapies, application of advanced monitoring technologies and extensive interpretation of multiple databases. Critical Care Time devoted to patient care services described in this note is 35 minutes. Anders Simmonds ACNP-BC [facility] Pulmonary/Critical Care Pager # [phone] OR # [phone] if no answer  08/29/2011, 9:24 AM  Reviewed above and examined pt.  Appreciate help from CCS.  Will continue IV fluids and Abx.  F/U and replace electrolytes as needed.  Monitor renal fx and urine outpt.  Hopefully will be ready for OR 12/12.  Jacia Sickman 08/29/2011, 10:13 AM Pager:  [phone]  Critical care time 35 minutes

## 2011-08-29 NOTE — ED Notes (Signed)
Per order NG tube was placed without incident and well tolerated.

## 2011-08-29 NOTE — ED Notes (Signed)
Pt taken to floor via stretcher without incident.

## 2011-08-29 NOTE — ED Notes (Signed)
Called floor to give report and spoke with Carollee Herter, RN.

## 2011-08-29 NOTE — ED Notes (Signed)
Pt lungs are wheezing all fields with expiratory and inspiratory wheezing noted.

## 2011-08-29 NOTE — ED Notes (Signed)
Due to multiple incompatible IV medications, multiple IV sites were accessed to accomodate  IVF concurrently.

## 2011-08-29 NOTE — ED Notes (Signed)
Pt reports that her pain was lessened after Fentanyl but is now returning along with nausea without emesis. Pt's breath is very malodorous in that the smell resembles fecal matter. Dr Preston Fleeting and PA notified. New orders received.

## 2011-08-29 NOTE — ED Provider Notes (Signed)
I have personally performed and participated in all the services and procedures documented herein. I have reviewed the findings with the patient. See separate progress note.  Dione Booze, MD 08/29/11 [phone]

## 2011-08-29 NOTE — Progress Notes (Signed)
eLink Physician-Brief Progress Note Patient Name: Lori [name] DOB: [birth year] MRN: [id number]  Date of Service  08/29/2011   HPI/Events of Note   Very low k  eICU Interventions  Add kcl to fluids, add more kcl iv abg   Intervention Category Major Interventions: Electrolyte abnormality - evaluation and management  Nelda Bucks. 08/29/2011, 6:33 AM

## 2011-08-29 NOTE — Progress Notes (Signed)
Subjective: Patient is awake, alert; mild abdominal pain Admits to smoking at least 1 ppd despite history of lung cancer Heavy alcohol use with at least 1 pint of liquor each day.  No documented liver disease and normal liver functions. Remains hypotensive with SBP 80-90's Large NG output - make abdomen feel better  Objective: Vital signs in last 24 hours: Temp:  [97.8 F (36.6 C)-98.7 F (37.1 C)] 98.7 F (37.1 C) (12/11 0800) Pulse Rate:  [85-118] 89  (12/11 0700) Resp:  [14-34] 21  (12/11 0700) BP: (74-110)/(35-69) 87/45 mmHg (12/11 0700) SpO2:  [61 %-100 %] 99 % (12/11 0700) Weight:  [117 lb 8.1 oz (53.3 kg)] 117 lb 8.1 oz (53.3 kg) (12/11 0425)    Intake/Output from previous day: 12/10 0701 - 12/11 0700 In: 5195.5 [P.O.:240; I.V.:4305.5; IV Piggyback:650] Out: 5850 [Urine:1950; Emesis/NG output:3900] Intake/Output this shift:    General appearance: alert, appears older than stated age and no distress GI: minimal distention, hypoactive bowel sounds, mildly tender in pelvis  Lab Results:   Basename 08/29/11 0500 08/28/11 1834  WBC 17.5* 23.0*  HGB 12.2 15.1*  HCT 35.0* 41.0  PLT 209 266   BMET  Basename 08/29/11 0500 08/28/11 1834  NA 124* 119*  K 2.1* 2.5*  CL 77* <65*  CO2 39* 47*  GLUCOSE 87 121*  BUN 69* 79*  CREATININE 2.03* 2.44*  CALCIUM 7.8* 10.2   PT/INR No results found for this basename: LABPROT:2,INR:2 in the last 72 hours ABG  Basename 08/28/11 2129  PHART 7.533*  HCO3 50.5*    Studies/Results: Ct Abdomen Pelvis Wo Contrast  08/28/2011  *RADIOLOGY REPORT*  Clinical Data: The abdominal pain.  Nausea and vomiting.  Green bowel for 1 week.  Distended abdomen.  Right-sided back pain and weakness.  Elevated creatinine.  CT ABDOMEN AND PELVIS WITHOUT CONTRAST  Technique:  Multidetector CT imaging of the abdomen and pelvis was performed following the standard protocol without intravenous contrast.  Comparison: PET CT 10/19/2009  Findings:  Focal consolidation in the left lung base posteriorly suggesting pneumonia.  Mild rotation pattern with jejunum on the right side of the abdomen.  The stomach and proximal small bowel are markedly distended with contrast material.  Decompressed ileum. The transition zone appears to be low in the anterior pelvis.  No mass lesion is demonstrated.  Changes may represent adhesion or torsion.  Changes are consistent with high-grade small bowel obstruction.  Calcified granulomas in the spleen.  The liver, gallbladder, and pancreas are unremarkable.  Calcifications in the abdominal aorta without aneurysm.  Calcifications in the splenic artery.  The adrenal glands and kidneys are unremarkable.  No free fluid or free air in the abdomen.  No evidence of significant bowel wall thickening.  Pelvis:  The bladder wall is not thickened.  No free or loculated pelvic fluid collections.  The colon is mostly decompressed with stool present.  No significant colonic wall thickening or inflammatory change.  Diverticulosis of the sigmoid region.  The appendix is not visualized.  Degenerative changes in the lumbar spine.  Compression of the L1 vertebra.  This is stable since previous chest CT dated 03/20/2011.  IMPRESSION: High-grade small bowel obstruction at the level of the distal jejunum with transition zone noted in the lower anterior pelvis. 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  Changes may represent volvulus.  No evidence of bowel wall thickening, pneumatosis, free fluid, or free air.  Results discussed with  Dr. Preston Fleeting at the time of dictation, 2131 hours on 08/28/2011.  Original Report Authenticated By: Marlon Pel, M.D.   Ct Head Wo Contrast  08/28/2011  *RADIOLOGY REPORT*  Clinical Data: The patient fell and hit head 2 weeks ago.  CT HEAD WITHOUT CONTRAST  Technique:  Contiguous axial images were obtained from the base of the skull through the vertex without  contrast.  Comparison: 04/22/2008  Findings: Diffuse cerebral atrophy.  Low attenuation changes in the deep white matter consistent small vessel ischemic change.  No mass effect or midline shift.  No abnormal extra-axial fluid collections.  Gray-white matter junctions are distinct.  Basal cisterns are not effaced.  No evidence of acute intracranial hemorrhage.  Vascular calcifications in the intracranial arteries. No depressed skull fractures.  Visualized mastoid air cells are not opacified.  Opacification of the right ethmoid air cells and right maxillary antrum, progressing since the previous study, most consistent with inflammatory process.  No significant change in the intracranial contents since the previous study.  IMPRESSION: No evidence of acute intracranial hemorrhage, mass lesion, or acute infarct.  Opacification of right ethmoid air cells and maxillary antra.  Original Report Authenticated By: Marlon Pel, M.D.   Dg Chest Port 1 View  08/28/2011  *RADIOLOGY REPORT*  Clinical Data: Shortness of breath.  Low blood pressure.  Lung cancer post therapy.  PORTABLE CHEST - 1 VIEW  Comparison: 02/22/2010 chest x-ray.  03/20/2011 CT.  Findings: Fullness of the right hilum appears slightly more prominent than on the prior chest x-ray.  This may be related to differences in technique although recurrent tumor at this level is not excluded.  Left base infiltrate/atelectasis new since prior examination.  Central pulmonary vascular prominence without pulmonary edema.  Calcified mildly tortuous aorta.  Remote right humeral neck fracture.  Abnormal small bowel loops along the right hemidiaphragm measuring up to 4 cm.  If there is clinical suspicion for primary abdominal/small bowel abnormality, follow-up imaging whether by plain film or CT recommended.  IMPRESSION: Fullness of the right hilum appears slightly more prominent than on the prior chest x-ray.  This may be related to differences in technique although  recurrent tumor at this level is not excluded.  Left base infiltrate/atelectasis new since prior examination.  Abnormal dilated gas-filled small bowel loops.  Results discussed with Dr.Steinl.  Original Report Authenticated By: [place], M.D.    Anti-infectives: Anti-infectives     Start     Dose/Rate Route Frequency Ordered Stop   08/29/11 0600   piperacillin-tazobactam (ZOSYN) IVPB 2.25 g        2.25 g 100 mL/hr over 30 Minutes Intravenous 4 times per day 08/29/11 0425     08/28/11 2345   piperacillin-tazobactam (ZOSYN) IVPB 3.375 g        3.375 g 12.5 mL/hr over 240 Minutes Intravenous NOW 08/28/11 2337 08/29/11 0718   08/28/11 2315   vancomycin (VANCOCIN) IVPB 1000 mg/200 mL premix  Status:  Discontinued        1,000 mg 200 mL/hr over 60 Minutes Intravenous  Once 08/28/11 2301 08/28/11 2325   08/28/11 2315   piperacillin-tazobactam (ZOSYN) IVPB 3.375 g  Status:  Discontinued        3.375 g 12.5 mL/hr over 240 Minutes Intravenous  Once 08/28/11 2301 08/28/11 2325          Assessment/Plan: High-grade SBO with complete obstruction for several days Severe electrolyte/ metabolic derangement Will allow CCM to safely correct her electrolyte imbalance If patient  is stable and electrolytes have been corrected, will plan exploratory laparotomy tomorrow for small bowel obstruction.  No emergent surgical indications at this time.  LOS: 1 day    Shanley Furlough K. 08/29/2011

## 2011-08-30 ENCOUNTER — Encounter (HOSPITAL_COMMUNITY): Payer: Self-pay | Admitting: Anesthesiology

## 2011-08-30 ENCOUNTER — Inpatient Hospital Stay (HOSPITAL_COMMUNITY): Payer: Medicare Other | Admitting: Anesthesiology

## 2011-08-30 ENCOUNTER — Inpatient Hospital Stay (HOSPITAL_COMMUNITY): Payer: Medicare Other

## 2011-08-30 ENCOUNTER — Encounter (HOSPITAL_COMMUNITY)
Admission: EM | Disposition: A | Discharge status: Home / Self Care | Payer: Self-pay | Source: Home / Self Care | Attending: Internal Medicine

## 2011-08-30 HISTORY — PX: LAPAROTOMY: SHX154

## 2011-08-30 LAB — PREALBUMIN: Prealbumin: 9.5 mg/dL — ABNORMAL LOW (ref 17.0–34.0)

## 2011-08-30 LAB — COMPREHENSIVE METABOLIC PANEL
ALT: 9 U/L (ref 0–35)
Alkaline Phosphatase: 69 U/L (ref 39–117)
BUN: 23 mg/dL (ref 6–23)
CO2: 31 mEq/L (ref 19–32)
Chloride: 97 mEq/L (ref 96–112)
GFR calc Af Amer: 71 mL/min — ABNORMAL LOW (ref >90)
GFR calc non Af Amer: 61 mL/min — ABNORMAL LOW (ref >90)
Glucose, Bld: 100 mg/dL — ABNORMAL HIGH (ref 70–99)
Potassium: 3.7 mEq/L (ref 3.5–5.1)
Sodium: 134 mEq/L — ABNORMAL LOW (ref 135–145)
Total Protein: 5.3 g/dL — ABNORMAL LOW (ref 6.0–8.3)

## 2011-08-30 LAB — CBC
HCT: 36.6 % (ref 36.0–46.0)
MCV: 104.9 fL — ABNORMAL HIGH (ref 78.0–100.0)
RBC: 3.49 MIL/uL — ABNORMAL LOW (ref 3.87–5.11)
WBC: 13.7 10*3/uL — ABNORMAL HIGH (ref 4.0–10.5)

## 2011-08-30 LAB — GLUCOSE, CAPILLARY
Glucose-Capillary: 99 mg/dL (ref 70–99)
Glucose-Capillary: 81 mg/dL (ref 70–99)
Glucose-Capillary: 83 mg/dL (ref 70–99)
Glucose-Capillary: 80 mg/dL (ref 70–99)
Glucose-Capillary: 67 mg/dL — ABNORMAL LOW (ref 70–99)

## 2011-08-30 SURGERY — LAPAROTOMY, EXPLORATORY
Anesthesia: General | Site: Abdomen | Wound class: Clean Contaminated

## 2011-08-30 MED ORDER — SUCCINYLCHOLINE CHLORIDE 20 MG/ML IJ SOLN
INTRAMUSCULAR | Status: DC | PRN
  Administered 2011-08-30: 100 mg via INTRAVENOUS

## 2011-08-30 MED ORDER — FENTANYL CITRATE 0.05 MG/ML IJ SOLN
INTRAMUSCULAR | Status: DC | PRN
  Administered 2011-08-30: 100 ug via INTRAVENOUS

## 2011-08-30 MED ORDER — SODIUM CHLORIDE 0.9 % IR SOLN
Status: DC | PRN
  Administered 2011-08-30: 1000 mL

## 2011-08-30 MED ORDER — MEPERIDINE HCL 25 MG/ML IJ SOLN: 6.2500 mg | INTRAMUSCULAR | Status: DC | PRN

## 2011-08-30 MED ORDER — PROMETHAZINE HCL 25 MG/ML IJ SOLN: 6.2500 mg | INTRAMUSCULAR | Status: DC | PRN

## 2011-08-30 MED ORDER — NEOSTIGMINE METHYLSULFATE 1 MG/ML IJ SOLN
INTRAMUSCULAR | Status: DC | PRN
  Administered 2011-08-30: 5 mg via INTRAVENOUS

## 2011-08-30 MED ORDER — PIPERACILLIN-TAZOBACTAM 3.375 G IVPB
3.3750 g | Freq: Three times a day (TID) | INTRAVENOUS | Status: DC
  Administered 2011-08-30 – 2011-09-05 (×17): 3.375 g via INTRAVENOUS
  Filled 2011-08-30 (×22): qty 50

## 2011-08-30 MED ORDER — HYDROMORPHONE HCL PF 1 MG/ML IJ SOLN
0.2500 mg | INTRAMUSCULAR | Status: DC | PRN
  Administered 2011-08-30: 0.25 mg via INTRAVENOUS
  Administered 2011-08-30: 0.5 mg via INTRAVENOUS
  Administered 2011-08-30: 0.25 mg via INTRAVENOUS

## 2011-08-30 MED ORDER — PHENYLEPHRINE HCL 10 MG/ML IJ SOLN
INTRAMUSCULAR | Status: DC | PRN
  Administered 2011-08-30 (×2): 120 ug via INTRAVENOUS

## 2011-08-30 MED ORDER — GLYCOPYRROLATE 0.2 MG/ML IJ SOLN
INTRAMUSCULAR | Status: DC | PRN
  Administered 2011-08-30: .6 mg via INTRAVENOUS

## 2011-08-30 MED ORDER — PROPOFOL 10 MG/ML IV EMUL
INTRAVENOUS | Status: DC | PRN
  Administered 2011-08-30: 100 mg via INTRAVENOUS

## 2011-08-30 MED ORDER — ROCURONIUM BROMIDE 100 MG/10ML IV SOLN
INTRAVENOUS | Status: DC | PRN
  Administered 2011-08-30: 20 mg via INTRAVENOUS

## 2011-08-30 MED ORDER — HYDROMORPHONE HCL PF 1 MG/ML IJ SOLN
1.0000 mg | INTRAMUSCULAR | Status: DC | PRN
  Administered 2011-08-30 – 2011-09-05 (×19): 1 mg via INTRAVENOUS
  Filled 2011-08-30 (×20): qty 1

## 2011-08-30 MED ORDER — LACTATED RINGERS IV SOLN
INTRAVENOUS | Status: DC | PRN
  Administered 2011-08-30 (×2): via INTRAVENOUS

## 2011-08-30 SURGICAL SUPPLY — 45 items
BLADE SURG ROTATE 9660 (MISCELLANEOUS) IMPLANT
CANISTER SUCTION 2500CC (MISCELLANEOUS) ×2 IMPLANT
CHLORAPREP W/TINT 26ML (MISCELLANEOUS) ×2 IMPLANT
CLOTH BEACON ORANGE TIMEOUT ST (SAFETY) ×2 IMPLANT
COVER SURGICAL LIGHT HANDLE (MISCELLANEOUS) ×2 IMPLANT
DRAPE LAPAROSCOPIC ABDOMINAL (DRAPES) ×2 IMPLANT
DRAPE UTILITY 15X26 W/TAPE STR (DRAPE) ×4 IMPLANT
DRAPE WARM FLUID 44X44 (DRAPE) ×2 IMPLANT
ELECT BLADE 6.5 EXT (BLADE) ×1 IMPLANT
ELECT CAUTERY BLADE 6.4 (BLADE) ×2 IMPLANT
ELECT REM PT RETURN 9FT ADLT (ELECTROSURGICAL) ×2
ELECTRODE REM PT RTRN 9FT ADLT (ELECTROSURGICAL) ×1 IMPLANT
GAUZE SPONGE 4X4 12PLY STRL LF (GAUZE/BANDAGES/DRESSINGS) ×1 IMPLANT
GAUZE SPONGE 4X4 16PLY XRAY LF (GAUZE/BANDAGES/DRESSINGS) IMPLANT
GLOVE BIO SURGEON STRL SZ7 (GLOVE) ×4 IMPLANT
GLOVE BIO SURGEON STRL SZ7.5 (GLOVE) ×1 IMPLANT
GLOVE BIOGEL PI IND STRL 7.5 (GLOVE) ×1 IMPLANT
GLOVE BIOGEL PI INDICATOR 7.5 (GLOVE) ×3
GLOVE ECLIPSE 7.5 STRL STRAW (GLOVE) ×2 IMPLANT
GOWN PREVENTION PLUS XLARGE (GOWN DISPOSABLE) ×1 IMPLANT
GOWN STRL NON-REIN LRG LVL3 (GOWN DISPOSABLE) ×5 IMPLANT
KIT BASIN OR (CUSTOM PROCEDURE TRAY) ×2 IMPLANT
KIT ROOM TURNOVER OR (KITS) ×2 IMPLANT
LIGASURE IMPACT 36 18CM CVD LR (INSTRUMENTS) IMPLANT
NS IRRIG 1000ML POUR BTL (IV SOLUTION) ×3 IMPLANT
PACK GENERAL/GYN (CUSTOM PROCEDURE TRAY) ×2 IMPLANT
PAD ARMBOARD 7.5X6 YLW CONV (MISCELLANEOUS) ×3 IMPLANT
SPECIMEN JAR LARGE (MISCELLANEOUS) IMPLANT
SPONGE GAUZE 4X4 12PLY (GAUZE/BANDAGES/DRESSINGS) ×2 IMPLANT
SPONGE LAP 18X18 X RAY DECT (DISPOSABLE) IMPLANT
STAPLER VISISTAT 35W (STAPLE) ×2 IMPLANT
SUCTION POOLE TIP (SUCTIONS) ×1 IMPLANT
SUT PDS AB 1 TP1 96 (SUTURE) ×2 IMPLANT
SUT SILK 2 0 SH CR/8 (SUTURE) ×2 IMPLANT
SUT SILK 2 0 TIES 10X30 (SUTURE) ×2 IMPLANT
SUT SILK 3 0 SH CR/8 (SUTURE) ×2 IMPLANT
SUT SILK 3 0 TIES 10X30 (SUTURE) ×2 IMPLANT
SUT VIC AB 3-0 SH 18 (SUTURE) IMPLANT
TAPE CLOTH SURG 4X10 WHT LF (GAUZE/BANDAGES/DRESSINGS) ×1 IMPLANT
TOWEL OR 17X24 6PK STRL BLUE (TOWEL DISPOSABLE) ×2 IMPLANT
TOWEL OR 17X26 10 PK STRL BLUE (TOWEL DISPOSABLE) ×2 IMPLANT
TRAY FOLEY CATH 14FRSI W/METER (CATHETERS) IMPLANT
UNDERPAD 30X30 INCONTINENT (UNDERPADS AND DIAPERS) IMPLANT
WATER STERILE IRR 1000ML POUR (IV SOLUTION) ×1 IMPLANT
YANKAUER SUCT BULB TIP NO VENT (SUCTIONS) ×1 IMPLANT

## 2011-08-30 NOTE — Preoperative (Signed)
Beta Blockers   Reason not to administer Beta Blockers:Not Applicable 

## 2011-08-30 NOTE — Progress Notes (Signed)
Subjective: Still with some abdominal pain - no flatus or BM X-rays - still consistent with SBO Large NG output  Objective: Vital signs in last 24 hours: Temp:  [98.1 F (36.7 C)-99.4 F (37.4 C)] 98.4 F (36.9 C) (12/12 0400) Pulse Rate:  [83-100] 93  (12/12 0700) Resp:  [15-24] 16  (12/12 0700) BP: (82-116)/(41-65) 111/63 mmHg (12/12 0700) SpO2:  [96 %-100 %] 100 % (12/12 0700) Weight:  [116 lb 13.5 oz (53 kg)] 116 lb 13.5 oz (53 kg) (12/12 0509)    Intake/Output from previous day: 12/11 0701 - 12/12 0700 In: 6125 [I.V.:5315; IV Piggyback:810] Out: 1610 [Urine:5175; Emesis/NG output:2450] Intake/Output this shift:    General appearance: alert and no distress GI: Less distended, still mildly tender  Lab Results:   Basename 08/30/11 0500 08/29/11 0500  WBC 13.7* 17.5*  HGB 11.9* 12.2  HCT 36.6 35.0*  PLT 253 209   BMET  Basename 08/30/11 0500 08/29/11 2240  NA 134* 133*  K 3.7 4.0  CL 97 97  CO2 31 29  GLUCOSE 100* 66*  BUN 23 29*  CREATININE 0.93 1.07  CALCIUM 8.4 7.9*   PT/INR  Basename 08/30/11 0500 08/29/11 0958  LABPROT 14.8 14.6  INR 1.14 1.12   ABG  Basename 08/29/11 0938 08/28/11 2129  PHART 7.466* 7.533*  HCO3 37.5* 50.5*    Studies/Results: Ct Abdomen Pelvis Wo Contrast  08/28/2011  *RADIOLOGY REPORT*  Clinical Data: The abdominal pain.  Nausea and vomiting.  Green bowel for 1 week.  Distended abdomen.  Right-sided back pain and weakness.  Elevated creatinine.  CT ABDOMEN AND PELVIS WITHOUT CONTRAST  Technique:  Multidetector CT imaging of the abdomen and pelvis was performed following the standard protocol without intravenous contrast.  Comparison: PET CT 10/19/2009  Findings: Focal consolidation in the left lung base posteriorly suggesting pneumonia.  Mild rotation pattern with jejunum on the right side of the abdomen.  The stomach and proximal small bowel are markedly distended with contrast material.  Decompressed ileum. The transition  zone appears to be low in the anterior pelvis.  No mass lesion is demonstrated.  Changes may represent adhesion or torsion.  Changes are consistent with high-grade small bowel obstruction.  Calcified granulomas in the spleen.  The liver, gallbladder, and pancreas are unremarkable.  Calcifications in the abdominal aorta without aneurysm.  Calcifications in the splenic artery.  The adrenal glands and kidneys are unremarkable.  No free fluid or free air in the abdomen.  No evidence of significant bowel wall thickening.  Pelvis:  The bladder wall is not thickened.  No free or loculated pelvic fluid collections.  The colon is mostly decompressed with stool present.  No significant colonic wall thickening or inflammatory change.  Diverticulosis of the sigmoid region.  The appendix is not visualized.  Degenerative changes in the lumbar spine.  Compression of the L1 vertebra.  This is stable since previous chest CT dated 03/20/2011.  IMPRESSION: High-grade small bowel obstruction at the level of the distal jejunum with transition zone noted in the lower anterior pelvis. 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  Changes may represent volvulus.  No evidence of bowel wall thickening, pneumatosis, free fluid, or free air.  Results discussed with Dr. Preston Fleeting at the time of dictation, 2131 hours on 08/28/2011.  Original Report Authenticated By: Marlon Pel, M.D.   Ct Head Wo Contrast  08/28/2011  *RADIOLOGY REPORT*  Clinical Data: The patient  fell and hit head 2 weeks ago.  CT HEAD WITHOUT CONTRAST  Technique:  Contiguous axial images were obtained from the base of the skull through the vertex without contrast.  Comparison: 04/22/2008  Findings: Diffuse cerebral atrophy.  Low attenuation changes in the deep white matter consistent small vessel ischemic change.  No mass effect or midline shift.  No abnormal extra-axial fluid collections.  Gray-white matter junctions are  distinct.  Basal cisterns are not effaced.  No evidence of acute intracranial hemorrhage.  Vascular calcifications in the intracranial arteries. No depressed skull fractures.  Visualized mastoid air cells are not opacified.  Opacification of the right ethmoid air cells and right maxillary antrum, progressing since the previous study, most consistent with inflammatory process.  No significant change in the intracranial contents since the previous study.  IMPRESSION: No evidence of acute intracranial hemorrhage, mass lesion, or acute infarct.  Opacification of right ethmoid air cells and maxillary antra.  Original Report Authenticated By: Marlon Pel, M.D.   Dg Chest Port 1 View  08/29/2011  *RADIOLOGY REPORT*  Clinical Data: Central line placement.  PORTABLE CHEST - 1 VIEW  Comparison: Chest x-ray 08/28/2011.  Findings: The left subclavian central venous catheter tip is in the mid SVC.  No complicating features.  The NG tube is coursing down the esophagus and into the stomach.  The cardiac silhouette, mediastinal and hilar contours are stable.  Stable surgical and radiation changes involving the right hilum and stable left basilar scarring.  IMPRESSION: Left subclavian center venous catheter in good position.  The tip is in the mid SVC.  No complicating features.  Original Report Authenticated By: P. Loralie Champagne, M.D.   Dg Chest Port 1 View  08/28/2011  *RADIOLOGY REPORT*  Clinical Data: Shortness of breath.  Low blood pressure.  Lung cancer post therapy.  PORTABLE CHEST - 1 VIEW  Comparison: 02/22/2010 chest x-ray.  03/20/2011 CT.  Findings: Fullness of the right hilum appears slightly more prominent than on the prior chest x-ray.  This may be related to differences in technique although recurrent tumor at this level is not excluded.  Left base infiltrate/atelectasis new since prior examination.  Central pulmonary vascular prominence without pulmonary edema.  Calcified mildly tortuous aorta.   Remote right humeral neck fracture.  Abnormal small bowel loops along the right hemidiaphragm measuring up to 4 cm.  If there is clinical suspicion for primary abdominal/small bowel abnormality, follow-up imaging whether by plain film or CT recommended.  IMPRESSION: Fullness of the right hilum appears slightly more prominent than on the prior chest x-ray.  This may be related to differences in technique although recurrent tumor at this level is not excluded.  Left base infiltrate/atelectasis new since prior examination.  Abnormal dilated gas-filled small bowel loops.  Results discussed with Dr.Steinl.  Original Report Authenticated By: Fuller Canada, M.D.   Dg Abd Acute W/chest  08/30/2011  *RADIOLOGY REPORT*  Clinical Data: Follow up small bowel obstruction, abdominal pain  ACUTE ABDOMEN SERIES (ABDOMEN 2 VIEW & CHEST 1 VIEW)  Comparison: CT abdomen pelvis of 08/28/2011 and portable chest x- ray of 08/28/2011  Findings: There is still opacity at the left lung base, suspicious for pneumonia.  The lungs are hyperaerated consistent with COPD. Prominent hilar structures remain possibly reflecting a degree of pulmonary arterial hypertension in this patient with COPD.  The left central venous line is noted with the tip in the mid SVC and no pneumothorax is seen.  An NG tube is present.  Portable supine and left lateral decubitus film of the abdomen show little change in the high-grade small bowel obstruction with gaseous distention of small bowel loops and possible mild edema of small bowel.  However on the decubitus film, no free air is seen. The bones are osteopenic.  An NG tube is present with the tip in the body of the stomach.  IMPRESSION:  1.  No significant change in high-grade small bowel obstruction. No free air. 2.  Opacity remains medially at the left lung base suspicious for pneumonia. 3.  NG tube tip in mid body of stomach. 4.  Left central venous line tip in mid SVC. 5.  COPD.  Original Report  Authenticated By: Juline Patch, M.D.    Anti-infectives: Anti-infectives     Start     Dose/Rate Route Frequency Ordered Stop   08/29/11 0600   piperacillin-tazobactam (ZOSYN) IVPB 2.25 g        2.25 g 100 mL/hr over 30 Minutes Intravenous 4 times per day 08/29/11 0425     08/28/11 2345   piperacillin-tazobactam (ZOSYN) IVPB 3.375 g        3.375 g 12.5 mL/hr over 240 Minutes Intravenous NOW 08/28/11 2337 08/29/11 0718   08/28/11 2315   vancomycin (VANCOCIN) IVPB 1000 mg/200 mL premix  Status:  Discontinued        1,000 mg 200 mL/hr over 60 Minutes Intravenous  Once 08/28/11 2301 08/28/11 2325   08/28/11 2315   piperacillin-tazobactam (ZOSYN) IVPB 3.375 g  Status:  Discontinued        3.375 g 12.5 mL/hr over 240 Minutes Intravenous  Once 08/28/11 2301 08/28/11 2325          Assessment/Plan:  Will proceed to OR today for exploratory laparotomy/ lysis of adhesions  LOS: 2 days    Lauriann Milillo K. 08/30/2011

## 2011-08-30 NOTE — Progress Notes (Signed)
Name: Lori [name] MRN: [id number] DOB: October 22, 1940    LOS: 2  PCCM Progress NOTE  History of Present Illness: 70 yo WF smoker brought to [facility] ED 08/28/2011 after 1 week of lower abdominal pain, nausea and vomiting. There was no fever or diarrhea.  She also reports decreased oral intake.  She continued to drink alcohol daily until 2 days ago.  CT scan demonstrated high grade SBO but no free air. PMHx lung cancer, HTN, ETOH  Lines / Drains: 12/10  NGT>>> 12/10  Foley>>> 12/11: left [place] cvl>>>  Cultures: 12/10  BC>>> 12/11 BC>>> 12/11 MRSA screen>>>negative  Antibiotics: 12/10  Zosyn (intraabdominal sepsis)>>>  Tests / Events: 12/10  Abdominal CT scan: high grade SBO, no free air  Subjective:  Intermittent cough.  Denies chest pain, dyspnea.  No change in abd pain.  Vital Signs: BP 120/72  Pulse 98  Temp(Src) 98.5 F (36.9 C) (Oral)  Resp 20  Ht 5\' 6"  (1.676 m)  Wt 116 lb 13.5 oz (53 kg)  BMI 18.86 kg/m2  SpO2 92%  Physical Examination:  General:  Appears malnourished, chronically ill Neuro:  Confused at times.  HEENT:  PERRL, no icterus, facial echymoses Neck:  No JVD   Cardiovascular:  RRR, no murmurs Lungs: bilateral air entry, with prolonged exp wheeze Abdomen:  Tender to palpation without focal tenderness, no focal peritoneal signs, hyperactive bowel sounds Musculoskeletal:  Moves all extremities to command Skin:  Intact  Lab Results  Component Value Date   WBC 13.7* 08/30/2011   HGB 11.9* 08/30/2011   HCT 36.6 08/30/2011   MCV 104.9* 08/30/2011   PLT 253 08/30/2011   Lab Results  Component Value Date   CREATININE 0.93 08/30/2011   BUN 23 08/30/2011   NA 134* 08/30/2011   K 3.7 08/30/2011   CL 97 08/30/2011   CO2 31 08/30/2011   Ct Abdomen Pelvis Wo Contrast  08/28/2011  *RADIOLOGY REPORT*  Clinical Data: The abdominal pain.  Nausea and vomiting.  Green bowel for 1 week.  Distended abdomen.  Right-sided back pain and weakness.  Elevated  creatinine.  CT ABDOMEN AND PELVIS WITHOUT CONTRAST  Technique:  Multidetector CT imaging of the abdomen and pelvis was performed following the standard protocol without intravenous contrast.  Comparison: PET CT 10/19/2009  Findings: Focal consolidation in the left lung base posteriorly suggesting pneumonia.  Mild rotation pattern with jejunum on the right side of the abdomen.  The stomach and proximal small bowel are markedly distended with contrast material.  Decompressed ileum. The transition zone appears to be low in the anterior pelvis.  No mass lesion is demonstrated.  Changes may represent adhesion or torsion.  Changes are consistent with high-grade small bowel obstruction.  Calcified granulomas in the spleen.  The liver, gallbladder, and pancreas are unremarkable.  Calcifications in the abdominal aorta without aneurysm.  Calcifications in the splenic artery.  The adrenal glands and kidneys are unremarkable.  No free fluid or free air in the abdomen.  No evidence of significant bowel wall thickening.  Pelvis:  The bladder wall is not thickened.  No free or loculated pelvic fluid collections.  The colon is mostly decompressed with stool present.  No significant colonic wall thickening or inflammatory change.  Diverticulosis of the sigmoid region.  The appendix is not visualized.  Degenerative changes in the lumbar spine.  Compression of the L1 vertebra.  This is stable since previous chest CT dated 03/20/2011.  IMPRESSION: High-grade small bowel obstruction at the level of the  distal jejunum with transition zone noted in the lower anterior pelvis. 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  Changes may represent volvulus.  No evidence of bowel wall thickening, pneumatosis, free fluid, or free air.  Results discussed with Dr. Preston Fleeting at the time of dictation, 2131 hours on 08/28/2011.  Original Report Authenticated By: Marlon Pel, M.D.   Ct Head Wo  Contrast  08/28/2011  *RADIOLOGY REPORT*  Clinical Data: The patient fell and hit head 2 weeks ago.  CT HEAD WITHOUT CONTRAST  Technique:  Contiguous axial images were obtained from the base of the skull through the vertex without contrast.  Comparison: 04/22/2008  Findings: Diffuse cerebral atrophy.  Low attenuation changes in the deep white matter consistent small vessel ischemic change.  No mass effect or midline shift.  No abnormal extra-axial fluid collections.  Gray-white matter junctions are distinct.  Basal cisterns are not effaced.  No evidence of acute intracranial hemorrhage.  Vascular calcifications in the intracranial arteries. No depressed skull fractures.  Visualized mastoid air cells are not opacified.  Opacification of the right ethmoid air cells and right maxillary antrum, progressing since the previous study, most consistent with inflammatory process.  No significant change in the intracranial contents since the previous study.  IMPRESSION: No evidence of acute intracranial hemorrhage, mass lesion, or acute infarct.  Opacification of right ethmoid air cells and maxillary antra.  Original Report Authenticated By: Marlon Pel, M.D.   Dg Chest Port 1 View  08/29/2011  *RADIOLOGY REPORT*  Clinical Data: Central line placement.  PORTABLE CHEST - 1 VIEW  Comparison: Chest x-ray 08/28/2011.  Findings: The left subclavian central venous catheter tip is in the mid SVC.  No complicating features.  The NG tube is coursing down the esophagus and into the stomach.  The cardiac silhouette, mediastinal and hilar contours are stable.  Stable surgical and radiation changes involving the right hilum and stable left basilar scarring.  IMPRESSION: Left subclavian center venous catheter in good position.  The tip is in the mid SVC.  No complicating features.  Original Report Authenticated By: P. Loralie Champagne, M.D.   Dg Chest Port 1 View  08/28/2011  *RADIOLOGY REPORT*  Clinical Data: Shortness of  breath.  Low blood pressure.  Lung cancer post therapy.  PORTABLE CHEST - 1 VIEW  Comparison: 02/22/2010 chest x-ray.  03/20/2011 CT.  Findings: Fullness of the right hilum appears slightly more prominent than on the prior chest x-ray.  This may be related to differences in technique although recurrent tumor at this level is not excluded.  Left base infiltrate/atelectasis new since prior examination.  Central pulmonary vascular prominence without pulmonary edema.  Calcified mildly tortuous aorta.  Remote right humeral neck fracture.  Abnormal small bowel loops along the right hemidiaphragm measuring up to 4 cm.  If there is clinical suspicion for primary abdominal/small bowel abnormality, follow-up imaging whether by plain film or CT recommended.  IMPRESSION: Fullness of the right hilum appears slightly more prominent than on the prior chest x-ray.  This may be related to differences in technique although recurrent tumor at this level is not excluded.  Left base infiltrate/atelectasis new since prior examination.  Abnormal dilated gas-filled small bowel loops.  Results discussed with Dr.Steinl.  Original Report Authenticated By: Fuller Canada, M.D.   Dg Abd Acute W/chest  08/30/2011  *RADIOLOGY REPORT*  Clinical Data: Follow up small bowel obstruction, abdominal pain  ACUTE ABDOMEN SERIES (  ABDOMEN 2 VIEW & CHEST 1 VIEW)  Comparison: CT abdomen pelvis of 08/28/2011 and portable chest x- ray of 08/28/2011  Findings: There is still opacity at the left lung base, suspicious for pneumonia.  The lungs are hyperaerated consistent with COPD. Prominent hilar structures remain possibly reflecting a degree of pulmonary arterial hypertension in this patient with COPD.  The left central venous line is noted with the tip in the mid SVC and no pneumothorax is seen.  An NG tube is present.  Portable supine and left lateral decubitus film of the abdomen show little change in the high-grade small bowel obstruction with gaseous  distention of small bowel loops and possible mild edema of small bowel.  However on the decubitus film, no free air is seen. The bones are osteopenic.  An NG tube is present with the tip in the body of the stomach.  IMPRESSION:  1.  No significant change in high-grade small bowel obstruction. No free air. 2.  Opacity remains medially at the left lung base suspicious for pneumonia. 3.  NG tube tip in mid body of stomach. 4.  Left central venous line tip in mid SVC. 5.  COPD.  Original Report Authenticated By: Juline Patch, M.D.   Assessment and Plan:  Small bowel obstruction with possible peritonitis -General surgery planning OR for 12/12 -NGT to intermittent Sx -D3/x Zosyn  Hypovolemic hyponatremia secondary to vomiting and alcohol abuse  Lab 08/30/11 0500 08/29/11 2240 08/29/11 1507 08/29/11 0500 08/28/11 1834  NA 134* 133* 133* 124* 119*  -decrease NS IV fluid to 75 ml/hr -trend Na -cortisol 15.6 from 12/11>>may need stress dose steroids post-op if she has trouble with blood pressure  Hypotension secondary to hypovolemia -improved 12/12 -Goal CVP > 8  Acute renal failure:  Lab 08/30/11 0500 08/29/11 2240 08/29/11 1507 08/29/11 0500 08/28/11 1834  CREATININE 0.93 1.07 1.24* 2.03* 2.44*  -improved with IV fluid -strict I&O  Hypokalemia:  Lab 08/30/11 0500 08/29/11 2240 08/29/11 1507 08/29/11 0500 08/28/11 1834  K 3.7 4.0 2.5* 2.1* 2.5*  -improved 12/12 -f/u and replace electrolytes as needed  Metabolic encephalopathy, history of ETOH abuse and high risk of DT -continue folate and thiamine IV -close obs in ICU. If develops confusion will start precedex gtt  Malnutrition, secondary to ETOH and emesis - f/u  Pre-Albumin -may need TPN rather soon -monitor for refeeding syndrome  Perihilar fullness on CXR with hx of lung cancer, tobacco abuse -will need repeat CT chest to further evaluate when more stable  Best practices / Disposition -->ICU status under PCCM, general  surgery consulting -->full code -->SCDs for DVT Px -->Protonix for GI Px -->NPO, NGT to intermittent Sx  Mihran Lebarron 08/30/2011, 9:42 AM Pager:  [phone]

## 2011-08-30 NOTE — Anesthesia Postprocedure Evaluation (Signed)
  Anesthesia Post-op Note  Patient: Lori [name]  Procedure(s) Performed:  EXPLORATORY LAPAROTOMY  Patient Location: PACU  Anesthesia Type: General  Level of Consciousness: sedated  Airway and Oxygen Therapy: Patient Spontanous Breathing and Patient connected to nasal cannula oxygen  Post-op Pain: none  Post-op Assessment: Post-op Vital signs reviewed, Patient's Cardiovascular Status Stable, Respiratory Function Stable and Patent Airway  Post-op Vital Signs: Reviewed and stable  Complications: No apparent anesthesia complications

## 2011-08-30 NOTE — Op Note (Signed)
Preop Diagnosis: Small bowel obstruction Postop diagnosis: Same Procedure performed: Exploratory laparotomy with lysis of adhesions Surgeon Chevy Virgo K. Anesthesia: Gen. endotracheal  Indications: This is a 70 year old female with significant past medical history who presents with over a week of abdominal distention, nausea,  vomiting and no bowel movements. She became severely dehydrated and had severe metabolic derangement when she arrived in the emergency department. She was resuscitated and rehydrated by critical care medicine. Her electrolytes are now within normal limits. She presents now for exploratory laparotomy.  Description of procedure: The patient was brought to the operating room and placed in a supine position on the operating room table. After an adequate level of general anesthesia was obtained the patient's abdomen was prepped with chloraprep and draped in sterile fashion. A timeout was taken to ensure the proper patient and proper procedure. We made a lower vertical midline incision. Dissection was carried down to the fascia which was divided vertically. We into the peritoneal cavity bluntly. We opened the peritoneum widely. We could see some dilated proximal small bowel and some small caliber distal small bowel. We began exploring the dilated small bowel. A very tight band of omentum was adherent down into the pelvis. We lysed this and this immediately relieved the obstruction. The small bowel appeared to be healthy after we relieved the obstruction. We then examined the small bowel in its entirety. We went of bowel from cecum back to the ligament of Treitz and this appeared to be free of adhesions. The omentum was densely adherent and at the pelvis and we divided the adhesions with cautery. We tied off a bleeding vessel in the omentum with a 2-0 silk suture. We then closed the fascia with double-stranded #1 PDS suture. The subcutaneous tissues were irrigated and staples were used to  close the skin. A clean dressing was applied. The patient was then extubated and brought to the recovery room in stable condition. All sponge, instrument, and needle counts were correct.  Wilmon Arms. Corliss Skains, MD, [facility]  08/30/2011 2:00 PM

## 2011-08-30 NOTE — Anesthesia Preprocedure Evaluation (Signed)
Anesthesia Evaluation  Patient identified by MRN, date of birth, ID band Patient awake    Reviewed: Allergy & Precautions, H&P , NPO status , Patient's Chart, lab work & pertinent test results  Airway Mallampati: II TM Distance: >3 FB Neck ROM: Full    Dental  (+) Edentulous Upper and Partial Lower   Pulmonary shortness of breath and with exertion, Current Smoker,  clear to auscultation        Cardiovascular hypertension, Pt. on medications Regular Normal    Neuro/Psych Negative Neurological ROS  Negative Psych ROS   GI/Hepatic Neg liver ROS, GERD-  Controlled,  Endo/Other  Negative Endocrine ROS  Renal/GU negative Renal ROS     Musculoskeletal negative musculoskeletal ROS (+)   Abdominal   Peds  Hematology negative hematology ROS (+)   Anesthesia Other Findings   Reproductive/Obstetrics negative OB ROS                           Anesthesia Physical Anesthesia Plan  ASA: III  Anesthesia Plan: General   Post-op Pain Management:    Induction: Intravenous, Rapid sequence and Cricoid pressure planned  Airway Management Planned: Oral ETT  Additional Equipment:   Intra-op Plan:   Post-operative Plan: Extubation in OR and Possible Post-op intubation/ventilation  Informed Consent: I have reviewed the patients History and Physical, chart, labs and discussed the procedure including the risks, benefits and alternatives for the proposed anesthesia with the patient or authorized representative who has indicated his/her understanding and acceptance.     Plan Discussed with: CRNA  Anesthesia Plan Comments:         Anesthesia Quick Evaluation

## 2011-08-30 NOTE — Transfer of Care (Signed)
Immediate Anesthesia Transfer of Care Note  Patient: Lori [name]  Procedure(s) Performed:  EXPLORATORY LAPAROTOMY  Patient Location: PACU  Anesthesia Type: General  Level of Consciousness: awake, alert  and oriented  Airway & Oxygen Therapy: Patient Spontanous Breathing  Post-op Assessment: Report given to PACU RN and Post -op Vital signs reviewed and stable  Post vital signs: Reviewed and stable  Complications: No apparent anesthesia complications

## 2011-08-30 NOTE — Progress Notes (Signed)
ANTIBIOTIC CONSULT NOTE - FOLLOW UP  Pharmacy Consult for Zosyn Indication: SBO with possible peritonitis  Assessment: 70 yo F on IV zosyn for r/o sepsis and possible peritonitis going to OR today for exploratory laparotomy.  Renal function/output has improved, so will increase to extended infusion q8h dosing.  WBC improved to 13.7, afebrile, BCx (12/11) NGTD x2.  Plan:  1. Increase Zosyn to 3.375gm IV Q8H  Ike Bene M 08/30/2011,10:31 AM  No Known Allergies  Patient Measurements: Height: 5\' 6"  (167.6 cm) Weight: 116 lb 13.5 oz (53 kg) IBW/kg (Calculated) : 59.3   Vital Signs: Temp: 98.5 F (36.9 C) (12/12 0800) Temp src: Oral (12/12 0800) BP: 120/72 mmHg (12/12 0900) Pulse Rate: 98  (12/12 0900) Intake/Output from previous day: 12/11 0701 - 12/12 0700 In: 6270 [I.V.:5460; IV Piggyback:810] Out: 7625 [Urine:5175; Emesis/NG output:2450] Intake/Output from this shift: Total I/O In: 290 [I.V.:290] Out: 900 [Urine:350; Emesis/NG output:550]  Labs:  Basename 08/30/11 0500 08/29/11 2240 08/29/11 1507 08/29/11 0500 08/28/11 1834  WBC 13.7* -- -- 17.5* 23.0*  HGB 11.9* -- -- 12.2 15.1*  PLT 253 -- -- 209 266  LABCREA -- -- -- -- --  CREATININE 0.93 1.07 1.24* -- --   Estimated Creatinine Clearance: 47.8 ml/min (by C-G formula based on Cr of 0.93). No results found for this basename: VANCOTROUGH:2,VANCOPEAK:2,VANCORANDOM:2,GENTTROUGH:2,GENTPEAK:2,GENTRANDOM:2,TOBRATROUGH:2,TOBRAPEAK:2,TOBRARND:2,AMIKACINPEAK:2,AMIKACINTROU:2,AMIKACIN:2, in the last 72 hours   Microbiology: Recent Results (from the past 720 hour(s))  CULTURE, BLOOD (ROUTINE X 2)     Status: Normal (Preliminary result)   Collection Time   08/28/11  6:20 PM      Component Value Range Status Comment   Specimen Description BLOOD ARM RIGHT   Final    Special Requests BOTTLES DRAWN AEROBIC AND ANAEROBIC 10CC   Final    Setup Time [id number]   Final    Culture     Final    Value:        BLOOD CULTURE  RECEIVED NO GROWTH TO DATE CULTURE WILL BE HELD FOR 5 DAYS BEFORE ISSUING A FINAL NEGATIVE REPORT   Report Status PENDING   Incomplete   CULTURE, BLOOD (ROUTINE X 2)     Status: Normal (Preliminary result)   Collection Time   08/28/11  6:35 PM      Component Value Range Status Comment   Specimen Description BLOOD ARM RIGHT   Final    Special Requests BOTTLES DRAWN AEROBIC AND ANAEROBIC 10CC EACH   Final    Setup Time [id number]   Final    Culture     Final    Value:        BLOOD CULTURE RECEIVED NO GROWTH TO DATE CULTURE WILL BE HELD FOR 5 DAYS BEFORE ISSUING A FINAL NEGATIVE REPORT   Report Status PENDING   Incomplete   CULTURE, BLOOD (ROUTINE X 2)     Status: Normal (Preliminary result)   Collection Time   08/29/11 12:15 AM      Component Value Range Status Comment   Specimen Description BLOOD RIGHT ARM   Final    Special Requests     Final    Value: BOTTLES DRAWN AEROBIC AND ANAEROBIC 8CC AEROBIC 4CC ANAEROBIC   Setup Time [id number]   Final    Culture     Final    Value:        BLOOD CULTURE RECEIVED NO GROWTH TO DATE CULTURE WILL BE HELD FOR 5 DAYS BEFORE ISSUING A FINAL NEGATIVE REPORT   Report Status PENDING  Incomplete   CULTURE, BLOOD (ROUTINE X 2)     Status: Normal (Preliminary result)   Collection Time   08/29/11 12:20 AM      Component Value Range Status Comment   Specimen Description BLOOD LEFT ARM   Final    Special Requests BOTTLES DRAWN AEROBIC AND ANAEROBIC 10CC EACH   Final    Setup Time [id number]   Final    Culture     Final    Value:        BLOOD CULTURE RECEIVED NO GROWTH TO DATE CULTURE WILL BE HELD FOR 5 DAYS BEFORE ISSUING A FINAL NEGATIVE REPORT   Report Status PENDING   Incomplete   MRSA PCR SCREENING     Status: Normal   Collection Time   08/29/11  4:17 AM      Component Value Range Status Comment   MRSA by PCR NEGATIVE  NEGATIVE  Final     Anti-infectives     Start     Dose/Rate Route Frequency Ordered Stop   08/29/11 0600   piperacillin-tazobactam (ZOSYN) IVPB 2.25 g       2.25 g 100 mL/hr over 30 Minutes Intravenous 4 times per day 08/29/11 0425     08/28/11 2345  piperacillin-tazobactam (ZOSYN) IVPB 3.375 g       3.375 g 12.5 mL/hr over 240 Minutes Intravenous NOW 08/28/11 2337 08/29/11 0718   08/28/11 2315   vancomycin (VANCOCIN) IVPB 1000 mg/200 mL premix  Status:  Discontinued        1,000 mg 200 mL/hr over 60 Minutes Intravenous  Once 08/28/11 2301 08/28/11 2325   08/28/11 2315   piperacillin-tazobactam (ZOSYN) IVPB 3.375 g  Status:  Discontinued        3.375 g 12.5 mL/hr over 240 Minutes Intravenous  Once 08/28/11 2301 08/28/11 2325

## 2011-08-31 ENCOUNTER — Encounter (HOSPITAL_COMMUNITY): Payer: Self-pay | Admitting: Surgery

## 2011-08-31 DIAGNOSIS — E876 Hypokalemia: Secondary | ICD-10-CM

## 2011-08-31 DIAGNOSIS — E871 Hypo-osmolality and hyponatremia: Secondary | ICD-10-CM

## 2011-08-31 DIAGNOSIS — N179 Acute kidney failure, unspecified: Secondary | ICD-10-CM

## 2011-08-31 DIAGNOSIS — K565 Intestinal adhesions [bands], unspecified as to partial versus complete obstruction: Secondary | ICD-10-CM

## 2011-08-31 LAB — CBC
Hemoglobin: 12.6 g/dL (ref 12.0–15.0)
MCH: 34.5 pg — ABNORMAL HIGH (ref 26.0–34.0)
Platelets: 280 10*3/uL (ref 150–400)
RBC: 3.65 MIL/uL — ABNORMAL LOW (ref 3.87–5.11)
WBC: 14 10*3/uL — ABNORMAL HIGH (ref 4.0–10.5)

## 2011-08-31 LAB — BASIC METABOLIC PANEL
Calcium: 8.3 mg/dL — ABNORMAL LOW (ref 8.4–10.5)
GFR calc Af Amer: >90 mL/min (ref >90)
GFR calc non Af Amer: 88 mL/min — ABNORMAL LOW (ref >90)
Glucose, Bld: 78 mg/dL (ref 70–99)
Potassium: 4 mEq/L (ref 3.5–5.1)
Sodium: 135 mEq/L (ref 135–145)

## 2011-08-31 LAB — GLUCOSE, CAPILLARY
Glucose-Capillary: 64 mg/dL — ABNORMAL LOW (ref 70–99)
Glucose-Capillary: 66 mg/dL — ABNORMAL LOW (ref 70–99)
Glucose-Capillary: 84 mg/dL (ref 70–99)
Glucose-Capillary: 75 mg/dL (ref 70–99)

## 2011-08-31 MED ORDER — ENOXAPARIN SODIUM 40 MG/0.4ML ~~LOC~~ SOLN
40.0000 mg | SUBCUTANEOUS | Status: DC
  Administered 2011-09-01 – 2011-09-04 (×4): 40 mg via SUBCUTANEOUS
  Filled 2011-08-31 (×6): qty 0.4

## 2011-08-31 NOTE — Progress Notes (Signed)
Name: Lori [name] MRN: [id number] DOB: 28-Dec-1940    LOS: 3  PCCM Progress NOTE  History of Present Illness: 70 yo WF smoker brought to [facility] ED 08/28/2011 after 1 week of lower abdominal pain, nausea and vomiting. There was no fever or diarrhea.  She also reports decreased oral intake.  She continued to drink alcohol daily until 2 days ago.  CT scan demonstrated high grade SBO but no free air. PMHx lung cancer, HTN, ETOH  Lines / Drains: 12/10  NGT>>> 12/10  Foley>>> 12/11: left [place] cvl>>>  Cultures: 12/10  BC>>> 12/11 BC>>> 12/11 MRSA screen>>>negative  Antibiotics: 12/10  Zosyn (intraabdominal sepsis)>>>  Tests / Events: 12/10  Abdominal CT scan: high grade SBO, no free air 12/12 Ex lap with lysis of adhesions  Subjective: Pain controlled.  Denies chest pain, dyspnea.    Vital Signs: BP 136/69  Pulse 102  Temp(Src) 97.5 F (36.4 C) (Oral)  Resp 21  Ht 5\' 6"  (1.676 m)  Wt 116 lb 13.5 oz (53 kg)  BMI 18.86 kg/m2  SpO2 100%  Physical Examination:  General:  Appears malnourished, chronically ill Neuro: Alert and oriented HEENT: NG tube in place Neck:  No JVD   Cardiovascular:  RRR, no murmurs Lungs: bilateral air entry, prolonged exhalation, no wheeze Abdomen:  Wound site clean Musculoskeletal:  Moves all extremities to command Skin:  Intact  Lab Results  Component Value Date   WBC 14.0* 08/31/2011   HGB 12.6 08/31/2011   HCT 38.3 08/31/2011   MCV 104.9* 08/31/2011   PLT 280 08/31/2011   Lab Results  Component Value Date   CREATININE 0.66 08/31/2011   BUN 13 08/31/2011   NA 135 08/31/2011   K 4.0 08/31/2011   CL 100 08/31/2011   CO2 27 08/31/2011   Dg Abd Acute W/chest  08/30/2011  *RADIOLOGY REPORT*  Clinical Data: Follow up small bowel obstruction, abdominal pain  ACUTE ABDOMEN SERIES (ABDOMEN 2 VIEW & CHEST 1 VIEW)  Comparison: CT abdomen pelvis of 08/28/2011 and portable chest x- ray of 08/28/2011  Findings: There is still opacity at the left  lung base, suspicious for pneumonia.  The lungs are hyperaerated consistent with COPD. Prominent hilar structures remain possibly reflecting a degree of pulmonary arterial hypertension in this patient with COPD.  The left central venous line is noted with the tip in the mid SVC and no pneumothorax is seen.  An NG tube is present.  Portable supine and left lateral decubitus film of the abdomen show little change in the high-grade small bowel obstruction with gaseous distention of small bowel loops and possible mild edema of small bowel.  However on the decubitus film, no free air is seen. The bones are osteopenic.  An NG tube is present with the tip in the body of the stomach.  IMPRESSION:  1.  No significant change in high-grade small bowel obstruction. No free air. 2.  Opacity remains medially at the left lung base suspicious for pneumonia. 3.  NG tube tip in mid body of stomach. 4.  Left central venous line tip in mid SVC. 5.  COPD.  Original Report Authenticated By: Juline Patch, M.D.   Assessment and Plan:  Small bowel obstruction with possible peritonitis -s/p exp lap 12/12>>post-op care per [facility] -NGT to intermittent Sx -D4/x Zosyn>>defer to surgery when this can be d/c'ed but likely soon  Hypovolemic hyponatremia secondary to vomiting and alcohol abuse  Lab 08/31/11 0430 08/30/11 0500 08/29/11 2240 08/29/11 1507 08/29/11 0500  NA 135 134* 133* 133* 124*  -improved -continue NS IV fluid to 75 ml/hr -trend Na -cortisol 15.6 from 12/11>>may need stress dose steroids post-op if she has trouble with blood pressure  Hypotension secondary to hypovolemia -improved 12/13 -d/c CVP checks -keep CVL in today, but likely be able to d/c soon  Acute renal failure:  Lab 08/31/11 0430 08/30/11 0500 08/29/11 2240 08/29/11 1507 08/29/11 0500  CREATININE 0.66 0.93 1.07 1.24* 2.03*  -improved with IV fluid -strict I&O -may be able to d/c foley soon  Hypokalemia:  Lab 08/31/11 0430 08/30/11 0500  08/29/11 2240 08/29/11 1507 08/29/11 0500  K 4.0 3.7 4.0 2.5* 2.1*  -improved -f/u and replace electrolytes as needed  Metabolic encephalopathy 2nd to hyponatremia with history of ETOH abuse and high risk of DT -continue folate and thiamine IV -no evidence for DT's  Protein calorie malnutrition, secondary to ETOH and emesis -NPO -advance diet as tolerated when okay with surgery  Perihilar fullness on CXR with hx of lung cancer, tobacco abuse -will need repeat CT chest to further evaluate when more stable>>can be done as outpt  Best practices / Disposition -->Transfer to non-tele floor bed, general surgery consulting -->full code -->SCDs for DVT Px -->Protonix for GI Px -->NPO, NGT to intermittent Sx  Will transfer service to [facility] hospitalist team for 12/14 and PCCM sign off.  Ilijah Doucet 08/31/2011, 9:39 AM Pager:  [phone]

## 2011-08-31 NOTE — Progress Notes (Signed)
Report and pt received from 2900. Pt oriented to room and floor. Pt IV infusing, NG tube to low intermitten suction. No needs expressed at this time

## 2011-08-31 NOTE — Progress Notes (Signed)
1 Day Post-Op exploratory laparotomy Subjective: Patient seems fairly ill-tempered, which is apparently baseline for her.  No flatus or BM yet.  Hemodynamically stable  Objective: Vital signs in last 24 hours: Temp:  [97.5 F (36.4 C)-99.6 F (37.6 C)] 97.5 F (36.4 C) (12/13 0757) Pulse Rate:  [95-107] 102  (12/13 0800) Resp:  [18-32] 21  (12/13 0800) BP: (108-139)/(51-90) 136/69 mmHg (12/13 0800) SpO2:  [92 %-100 %] 100 % (12/13 0800) FiO2 (%):  [2 %] 2 % (12/13 0800)    Intake/Output from previous day: 12/12 0701 - 12/13 0700 In: 3115 [I.V.:3040; IV Piggyback:75] Out: 4250 [Urine:2610; Emesis/NG output:1600; Blood:40] Intake/Output this shift: Total I/O In: 87.5 [I.V.:75; IV Piggyback:12.5] Out: 300 [Emesis/NG output:300]  General appearance: alert, appears older than stated age and no distress Abdomen - soft, dressing dry; incisional tenderness Lab Results:   Basename 08/31/11 0430 08/30/11 0500  WBC 14.0* 13.7*  HGB 12.6 11.9*  HCT 38.3 36.6  PLT 280 253   BMET  Basename 08/31/11 0430 08/30/11 0500  NA 135 134*  K 4.0 3.7  CL 100 97  CO2 27 31  GLUCOSE 78 100*  BUN 13 23  CREATININE 0.66 0.93  CALCIUM 8.3* 8.4   PT/INR  Basename 08/30/11 0500 08/29/11 0958  LABPROT 14.8 14.6  INR 1.14 1.12   ABG  Basename 08/29/11 0938 08/28/11 2129  PHART 7.466* 7.533*  HCO3 37.5* 50.5*    Studies/Results: Dg Abd Acute W/chest  08/30/2011  *RADIOLOGY REPORT*  Clinical Data: Follow up small bowel obstruction, abdominal pain  ACUTE ABDOMEN SERIES (ABDOMEN 2 VIEW & CHEST 1 VIEW)  Comparison: CT abdomen pelvis of 08/28/2011 and portable chest x- ray of 08/28/2011  Findings: There is still opacity at the left lung base, suspicious for pneumonia.  The lungs are hyperaerated consistent with COPD. Prominent hilar structures remain possibly reflecting a degree of pulmonary arterial hypertension in this patient with COPD.  The left central venous line is noted with the tip  in the mid SVC and no pneumothorax is seen.  An NG tube is present.  Portable supine and left lateral decubitus film of the abdomen show little change in the high-grade small bowel obstruction with gaseous distention of small bowel loops and possible mild edema of small bowel.  However on the decubitus film, no free air is seen. The bones are osteopenic.  An NG tube is present with the tip in the body of the stomach.  IMPRESSION:  1.  No significant change in high-grade small bowel obstruction. No free air. 2.  Opacity remains medially at the left lung base suspicious for pneumonia. 3.  NG tube tip in mid body of stomach. 4.  Left central venous line tip in mid SVC. 5.  COPD.  Original Report Authenticated By: Juline Patch, M.D.    Anti-infectives: Anti-infectives     Start     Dose/Rate Route Frequency Ordered Stop   08/30/11 1400  piperacillin-tazobactam (ZOSYN) IVPB 3.375 g       3.375 g 12.5 mL/hr over 240 Minutes Intravenous 3 times per day 08/30/11 1036     08/29/11 0600   piperacillin-tazobactam (ZOSYN) IVPB 2.25 g  Status:  Discontinued        2.25 g 100 mL/hr over 30 Minutes Intravenous 4 times per day 08/29/11 0425 08/30/11 1036   08/28/11 2345  piperacillin-tazobactam (ZOSYN) IVPB 3.375 g       3.375 g 12.5 mL/hr over 240 Minutes Intravenous NOW 08/28/11 2337 08/29/11 4540  08/28/11 2315   vancomycin (VANCOCIN) IVPB 1000 mg/200 mL premix  Status:  Discontinued        1,000 mg 200 mL/hr over 60 Minutes Intravenous  Once 08/28/11 2301 08/28/11 2325   08/28/11 2315   piperacillin-tazobactam (ZOSYN) IVPB 3.375 g  Status:  Discontinued        3.375 g 12.5 mL/hr over 240 Minutes Intravenous  Once 08/28/11 2301 08/28/11 2325          Assessment/Plan: s/p Procedure(s): EXPLORATORY LAPAROTOMY/ lysis of adhesions OK to transfer to floor to medicine service Awaiting bowel function - continue NG tube D/c Foley Elastic abdominal binder   LOS: 3 days    [place]x Brogden  K. 08/31/2011

## 2011-09-01 LAB — CBC
HCT: 38 % (ref 36.0–46.0)
Hemoglobin: 12.3 g/dL (ref 12.0–15.0)
MCV: 106.1 fL — ABNORMAL HIGH (ref 78.0–100.0)
RDW: 13.6 % (ref 11.5–15.5)
WBC: 13 10*3/uL — ABNORMAL HIGH (ref 4.0–10.5)

## 2011-09-01 LAB — BASIC METABOLIC PANEL
CO2: 26 mEq/L (ref 19–32)
Chloride: 102 mEq/L (ref 96–112)
Creatinine, Ser: 0.67 mg/dL (ref 0.50–1.10)
GFR calc Af Amer: >90 mL/min (ref >90)
Potassium: 6.1 mEq/L — ABNORMAL HIGH (ref 3.5–5.1)

## 2011-09-01 LAB — GLUCOSE, CAPILLARY
Glucose-Capillary: 73 mg/dL (ref 70–99)
Glucose-Capillary: 64 mg/dL — ABNORMAL LOW (ref 70–99)
Glucose-Capillary: 82 mg/dL (ref 70–99)

## 2011-09-01 MED ORDER — METRONIDAZOLE IN NACL 5-0.79 MG/ML-% IV SOLN
500.0000 mg | Freq: Three times a day (TID) | INTRAVENOUS | Status: DC
  Administered 2011-09-02 – 2011-09-04 (×8): 500 mg via INTRAVENOUS
  Filled 2011-09-01 (×10): qty 100

## 2011-09-01 MED ORDER — ENALAPRILAT 1.25 MG/ML IV SOLN
0.6250 mg | Freq: Four times a day (QID) | INTRAVENOUS | Status: DC
  Administered 2011-09-02 (×2): 0.625 mg via INTRAVENOUS
  Administered 2011-09-02: 14:00:00 via INTRAVENOUS
  Filled 2011-09-01 (×7): qty 0.5

## 2011-09-01 NOTE — Progress Notes (Signed)
Awaiting bowel function after extended SBO.   Mobilize patient. Ice chips/ NG tube until flatus.  Lori [name]. Corliss Skains, MD, [facility] Surgery  09/01/2011 9:43 AM

## 2011-09-01 NOTE — Progress Notes (Signed)
Patient ID: Lori [name], female   DOB: June 02, 1941, 70 y.o.   MRN: [id number] Subjective: Patient transferred from [facility] s/p exploratory lap with adhesiolysis.Patient has moved her bowel yet and has not passed flatus  Objective: Weight change:   Intake/Output Summary (Last 24 hours) at 09/01/11 1651 Last data filed at 09/01/11 1300  Gross per 24 hour  Intake      0 ml  Output   1200 ml  Net  -1200 ml   BP 149/88  Pulse 99  Temp(Src) 97.4 F (36.3 C) (Oral)  Resp 20  Ht 5\' 6"  (1.676 m)  Wt 53 kg (116 lb 13.5 oz)  BMI 18.86 kg/m2  SpO2 99% Physical Exam: General appearance: alert, cooperative and no distress,pallor,ng tube in situ Head: Normocephalic, without obvious abnormality, atraumatic Neck: no adenopathy, no carotid bruit, no JVD, supple, symmetrical, trachea midline and thyroid not enlarged, symmetric, no tenderness/mass/nodules Lungs: minimally scattered rales and rhonchi all over the lung field Heart: regular rate and rhythm, S1, S2 normal, no murmur, click, rub or gallop Abdomen: soft, non-tender; bowel sounds present; no masses,  no organomegaly Extremities: extremities normal, atraumatic, no cyanosis or edema Skin: Skin color, texture, turgor normal. No rashes or lesions  Lab Results: Results for orders placed during the hospital encounter of 08/28/11 (from the past 48 hour(s))  GLUCOSE, CAPILLARY     Status: Normal   Collection Time   08/30/11  5:13 PM      Component Value Range Comment   Glucose-Capillary 80  70 - 99 (mg/dL)   GLUCOSE, CAPILLARY     Status: Abnormal   Collection Time   08/30/11  9:13 PM      Component Value Range Comment   Glucose-Capillary 67 (*) 70 - 99 (mg/dL)   GLUCOSE, CAPILLARY     Status: Abnormal   Collection Time   08/30/11 11:45 PM      Component Value Range Comment   Glucose-Capillary 64 (*) 70 - 99 (mg/dL)   GLUCOSE, CAPILLARY     Status: Abnormal   Collection Time   08/31/11 12:08 AM      Component Value Range Comment   Glucose-Capillary 66 (*) 70 - 99 (mg/dL)   GLUCOSE, CAPILLARY     Status: Normal   Collection Time   08/31/11  1:22 AM      Component Value Range Comment   Glucose-Capillary 84  70 - 99 (mg/dL)   GLUCOSE, CAPILLARY     Status: Normal   Collection Time   08/31/11  4:23 AM      Component Value Range Comment   Glucose-Capillary 77  70 - 99 (mg/dL)   BASIC METABOLIC PANEL     Status: Abnormal   Collection Time   08/31/11  4:30 AM      Component Value Range Comment   Sodium 135  135 - 145 (mEq/L)    Potassium 4.0  3.5 - 5.1 (mEq/L)    Chloride 100  96 - 112 (mEq/L)    CO2 27  19 - 32 (mEq/L)    Glucose, Bld 78  70 - 99 (mg/dL)    BUN 13  6 - 23 (mg/dL)    Creatinine, Ser 4.09  0.50 - 1.10 (mg/dL)    Calcium 8.3 (*) 8.4 - 10.5 (mg/dL)    GFR calc non Af Amer 88 (*) >90 (mL/min)    GFR calc Af Amer >90  >90 (mL/min)   CBC     Status: Abnormal   Collection Time  08/31/11  4:30 AM      Component Value Range Comment   WBC 14.0 (*) 4.0 - 10.5 (K/uL)    RBC 3.65 (*) 3.87 - 5.11 (MIL/uL)    Hemoglobin 12.6  12.0 - 15.0 (g/dL)    HCT 16.1  09.6 - 04.5 (%)    MCV 104.9 (*) 78.0 - 100.0 (fL)    MCH 34.5 (*) 26.0 - 34.0 (pg)    MCHC 32.9  30.0 - 36.0 (g/dL)    RDW 40.9  81.1 - 91.4 (%)    Platelets 280  150 - 400 (K/uL)   GLUCOSE, CAPILLARY     Status: Normal   Collection Time   08/31/11  7:56 AM      Component Value Range Comment   Glucose-Capillary 70  70 - 99 (mg/dL)    Comment 1 Notify RN     GLUCOSE, CAPILLARY     Status: Normal   Collection Time   08/31/11 11:48 AM      Component Value Range Comment   Glucose-Capillary 73  70 - 99 (mg/dL)   GLUCOSE, CAPILLARY     Status: Normal   Collection Time   08/31/11  4:14 PM      Component Value Range Comment   Glucose-Capillary 75  70 - 99 (mg/dL)   BASIC METABOLIC PANEL     Status: Abnormal   Collection Time   09/01/11  6:23 AM      Component Value Range Comment   Sodium 138  135 - 145 (mEq/L)    Potassium 6.1 (*) 3.5 - 5.1  (mEq/L) NO VISIBLE HEMOLYSIS   Chloride 102  96 - 112 (mEq/L)    CO2 26  19 - 32 (mEq/L)    Glucose, Bld 72  70 - 99 (mg/dL)    BUN 10  6 - 23 (mg/dL)    Creatinine, Ser 7.82  0.50 - 1.10 (mg/dL)    Calcium 8.8  8.4 - 10.5 (mg/dL)    GFR calc non Af Amer 88 (*) >90 (mL/min)    GFR calc Af Amer >90  >90 (mL/min)   CBC     Status: Abnormal   Collection Time   09/01/11  6:23 AM      Component Value Range Comment   WBC 13.0 (*) 4.0 - 10.5 (K/uL)    RBC 3.58 (*) 3.87 - 5.11 (MIL/uL)    Hemoglobin 12.3  12.0 - 15.0 (g/dL)    HCT 95.6  21.3 - 08.6 (%)    MCV 106.1 (*) 78.0 - 100.0 (fL)    MCH 34.4 (*) 26.0 - 34.0 (pg)    MCHC 32.4  30.0 - 36.0 (g/dL)    RDW 57.8  46.9 - 62.9 (%)    Platelets 326  150 - 400 (K/uL)   GLUCOSE, CAPILLARY     Status: Abnormal   Collection Time   09/01/11  8:11 AM      Component Value Range Comment   Glucose-Capillary 69 (*) 70 - 99 (mg/dL)    Comment 1 Documented in Chart      Comment 2 Notify RN     GLUCOSE, CAPILLARY     Status: Normal   Collection Time   09/01/11 11:30 AM      Component Value Range Comment   Glucose-Capillary 73  70 - 99 (mg/dL)    Comment 1 Documented in Chart      Comment 2 Notify RN       Micro Results: Recent Results (from  the past 240 hour(s))  CULTURE, BLOOD (ROUTINE X 2)     Status: Normal (Preliminary result)   Collection Time   08/28/11  6:20 PM      Component Value Range Status Comment   Specimen Description BLOOD ARM RIGHT   Final    Special Requests BOTTLES DRAWN AEROBIC AND ANAEROBIC 10CC   Final    Setup Time [id number]   Final    Culture     Final    Value:        BLOOD CULTURE RECEIVED NO GROWTH TO DATE CULTURE WILL BE HELD FOR 5 DAYS BEFORE ISSUING A FINAL NEGATIVE REPORT   Report Status PENDING   Incomplete   CULTURE, BLOOD (ROUTINE X 2)     Status: Normal (Preliminary result)   Collection Time   08/28/11  6:35 PM      Component Value Range Status Comment   Specimen Description BLOOD ARM RIGHT   Final     Special Requests BOTTLES DRAWN AEROBIC AND ANAEROBIC 10CC EACH   Final    Setup Time [phone]   Final    Culture     Final    Value:        BLOOD CULTURE RECEIVED NO GROWTH TO DATE CULTURE WILL BE HELD FOR 5 DAYS BEFORE ISSUING A FINAL NEGATIVE REPORT   Report Status PENDING   Incomplete   CULTURE, BLOOD (ROUTINE X 2)     Status: Normal (Preliminary result)   Collection Time   08/29/11 12:15 AM      Component Value Range Status Comment   Specimen Description BLOOD RIGHT ARM   Final    Special Requests     Final    Value: BOTTLES DRAWN AEROBIC AND ANAEROBIC 8CC AEROBIC 4CC ANAEROBIC   Setup Time [id number]   Final    Culture     Final    Value:        BLOOD CULTURE RECEIVED NO GROWTH TO DATE CULTURE WILL BE HELD FOR 5 DAYS BEFORE ISSUING A FINAL NEGATIVE REPORT   Report Status PENDING   Incomplete   CULTURE, BLOOD (ROUTINE X 2)     Status: Normal (Preliminary result)   Collection Time   08/29/11 12:20 AM      Component Value Range Status Comment   Specimen Description BLOOD LEFT ARM   Final    Special Requests BOTTLES DRAWN AEROBIC AND ANAEROBIC 10CC EACH   Final    Setup Time [id number]   Final    Culture     Final    Value:        BLOOD CULTURE RECEIVED NO GROWTH TO DATE CULTURE WILL BE HELD FOR 5 DAYS BEFORE ISSUING A FINAL NEGATIVE REPORT   Report Status PENDING   Incomplete   MRSA PCR SCREENING     Status: Normal   Collection Time   08/29/11  4:17 AM      Component Value Range Status Comment   MRSA by PCR NEGATIVE  NEGATIVE  Final     Studies/Results: Ct Abdomen Pelvis Wo Contrast  08/28/2011  *RADIOLOGY REPORT*  Clinical Data: The abdominal pain.  Nausea and vomiting.  Green bowel for 1 week.  Distended abdomen.  Right-sided back pain and weakness.  Elevated creatinine.  CT ABDOMEN AND PELVIS WITHOUT CONTRAST  Technique:  Multidetector CT imaging of the abdomen and pelvis was performed following the standard protocol without intravenous contrast.  Comparison: PET  CT 10/19/2009  Findings: Focal consolidation in the left  lung base posteriorly suggesting pneumonia.  Mild rotation pattern with jejunum on the right side of the abdomen.  The stomach and proximal small bowel are markedly distended with contrast material.  Decompressed ileum. The transition zone appears to be low in the anterior pelvis.  No mass lesion is demonstrated.  Changes may represent adhesion or torsion.  Changes are consistent with high-grade small bowel obstruction.  Calcified granulomas in the spleen.  The liver, gallbladder, and pancreas are unremarkable.  Calcifications in the abdominal aorta without aneurysm.  Calcifications in the splenic artery.  The adrenal glands and kidneys are unremarkable.  No free fluid or free air in the abdomen.  No evidence of significant bowel wall thickening.  Pelvis:  The bladder wall is not thickened.  No free or loculated pelvic fluid collections.  The colon is mostly decompressed with stool present.  No significant colonic wall thickening or inflammatory change.  Diverticulosis of the sigmoid region.  The appendix is not visualized.  Degenerative changes in the lumbar spine.  Compression of the L1 vertebra.  This is stable since previous chest CT dated 03/20/2011.  IMPRESSION: High-grade small bowel obstruction at the level of the distal jejunum with transition zone noted in the lower anterior pelvis. 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  Changes may represent volvulus.  No evidence of bowel wall thickening, pneumatosis, free fluid, or free air.  Results discussed with Dr. Preston Fleeting at the time of dictation, 2131 hours on 08/28/2011.  Original Report Authenticated By: Marlon Pel, M.D.   Ct Head Wo Contrast  08/28/2011  *RADIOLOGY REPORT*  Clinical Data: The patient fell and hit head 2 weeks ago.  CT HEAD WITHOUT CONTRAST  Technique:  Contiguous axial images were obtained from the base of the skull through  the vertex without contrast.  Comparison: 04/22/2008  Findings: Diffuse cerebral atrophy.  Low attenuation changes in the deep white matter consistent small vessel ischemic change.  No mass effect or midline shift.  No abnormal extra-axial fluid collections.  Gray-white matter junctions are distinct.  Basal cisterns are not effaced.  No evidence of acute intracranial hemorrhage.  Vascular calcifications in the intracranial arteries. No depressed skull fractures.  Visualized mastoid air cells are not opacified.  Opacification of the right ethmoid air cells and right maxillary antrum, progressing since the previous study, most consistent with inflammatory process.  No significant change in the intracranial contents since the previous study.  IMPRESSION: No evidence of acute intracranial hemorrhage, mass lesion, or acute infarct.  Opacification of right ethmoid air cells and maxillary antra.  Original Report Authenticated By: Marlon Pel, M.D.   Dg Chest Port 1 View  08/29/2011  *RADIOLOGY REPORT*  Clinical Data: Central line placement.  PORTABLE CHEST - 1 VIEW  Comparison: Chest x-ray 08/28/2011.  Findings: The left subclavian central venous catheter tip is in the mid SVC.  No complicating features.  The NG tube is coursing down the esophagus and into the stomach.  The cardiac silhouette, mediastinal and hilar contours are stable.  Stable surgical and radiation changes involving the right hilum and stable left basilar scarring.  IMPRESSION: Left subclavian center venous catheter in good position.  The tip is in the mid SVC.  No complicating features.  Original Report Authenticated By: P. Loralie Champagne, M.D.   Dg Chest Port 1 View  08/28/2011  *RADIOLOGY REPORT*  Clinical Data: Shortness of breath.  Low blood pressure.  Lung cancer  post therapy.  PORTABLE CHEST - 1 VIEW  Comparison: 02/22/2010 chest x-ray.  03/20/2011 CT.  Findings: Fullness of the right hilum appears slightly more prominent than on the  prior chest x-ray.  This may be related to differences in technique although recurrent tumor at this level is not excluded.  Left base infiltrate/atelectasis new since prior examination.  Central pulmonary vascular prominence without pulmonary edema.  Calcified mildly tortuous aorta.  Remote right humeral neck fracture.  Abnormal small bowel loops along the right hemidiaphragm measuring up to 4 cm.  If there is clinical suspicion for primary abdominal/small bowel abnormality, follow-up imaging whether by plain film or CT recommended.  IMPRESSION: Fullness of the right hilum appears slightly more prominent than on the prior chest x-ray.  This may be related to differences in technique although recurrent tumor at this level is not excluded.  Left base infiltrate/atelectasis new since prior examination.  Abnormal dilated gas-filled small bowel loops.  Results discussed with Dr.Steinl.  Original Report Authenticated By: Fuller Canada, M.D.   Dg Abd Acute W/chest  08/30/2011  *RADIOLOGY REPORT*  Clinical Data: Follow up small bowel obstruction, abdominal pain  ACUTE ABDOMEN SERIES (ABDOMEN 2 VIEW & CHEST 1 VIEW)  Comparison: CT abdomen pelvis of 08/28/2011 and portable chest x- ray of 08/28/2011  Findings: There is still opacity at the left lung base, suspicious for pneumonia.  The lungs are hyperaerated consistent with COPD. Prominent hilar structures remain possibly reflecting a degree of pulmonary arterial hypertension in this patient with COPD.  The left central venous line is noted with the tip in the mid SVC and no pneumothorax is seen.  An NG tube is present.  Portable supine and left lateral decubitus film of the abdomen show little change in the high-grade small bowel obstruction with gaseous distention of small bowel loops and possible mild edema of small bowel.  However on the decubitus film, no free air is seen. The bones are osteopenic.  An NG tube is present with the tip in the body of the stomach.   IMPRESSION:  1.  No significant change in high-grade small bowel obstruction. No free air. 2.  Opacity remains medially at the left lung base suspicious for pneumonia. 3.  NG tube tip in mid body of stomach. 4.  Left central venous line tip in mid SVC. 5.  COPD.  Original Report Authenticated By: Juline Patch, M.D.   Medications: Scheduled Meds:   . antiseptic oral rinse  15 mL Mouth Rinse q12n4p  . chlorhexidine  15 mL Mouth Rinse BID  . enoxaparin (LOVENOX) injection  40 mg Subcutaneous Q24H  . folic acid  1 mg Intravenous Daily  . pantoprazole (PROTONIX) IV  40 mg Intravenous Q24H  . piperacillin-tazobactam (ZOSYN)  IV  3.375 g Intravenous Q8H  . thiamine  100 mg Intravenous Daily   Continuous Infusions:   . sodium chloride 20 mL/hr (08/30/11 0512)  . 0.9 % NaCl with KCl 40 mEq / L 75 mL/hr at 09/01/11 1051   PRN Meds:.albuterol, HYDROmorphone (DILAUDID) injection, ipratropium, ondansetron (ZOFRAN) IV  Assessment/Plan: #1 s/p Exploratory lap with adhesiolysis-will continue ng tube suctioning and mx per surgery.will add flagyl to regimen to cover anaerobes. #2 ?copd-will continue nebs treatment #3 Anemia-monitor hgb level #4 HTN-Bp control with iv vasotec    LOS: 4 days   Shawndell Varas 09/01/2011, 4:51 PM

## 2011-09-01 NOTE — Progress Notes (Signed)
2 Days Post-Op exploratory laparotomy Subjective: No flatus, NG not working, Not very happy.  Wants Orange juice  Objective: Vital signs in last 24 hours: Temp:  [97.5 F (36.4 C)-98.6 F (37 C)] 98.6 F (37 C) (12/14 0505) Pulse Rate:  [101-103] 103  (12/14 0505) Resp:  [18-21] 18  (12/14 0505) BP: (131-167)/(68-88) 164/88 mmHg (12/14 0505) SpO2:  [94 %-100 %] 96 % (12/14 0505) FiO2 (%):  [2 %] 2 % (12/13 0800)    Intake/Output from previous day: 12/13 0701 - 12/14 0700 In: 1097.5 [P.O.:360; I.V.:675; IV Piggyback:62.5] Out: 1125 [Urine:825; Emesis/NG output:300] Intake/Output this shift:    General appearance: alert, appears older than stated age and no distress Abdomen -tender, incision looks good, no distension. Hypoactive BS Pulm: Bilat wheezing and rales Lab Results:   [facility] 09/01/11 0623 08/31/11 0430  WBC 13.0* 14.0*  HGB 12.3 12.6  HCT 38.0 38.3  PLT 326 280   BMET  Basename 08/31/11 0430 08/30/11 0500  NA 135 134*  K 4.0 3.7  CL 100 97  CO2 27 31  GLUCOSE 78 100*  BUN 13 23  CREATININE 0.66 0.93  CALCIUM 8.3* 8.4   PT/INR  Basename 08/30/11 0500 08/29/11 0958  LABPROT 14.8 14.6  INR 1.14 1.12   ABG  Basename 08/29/11 0938  PHART 7.466*  HCO3 37.5*    Studies/Results: No results found.  Anti-infectives: Anti-infectives     Start     Dose/Rate Route Frequency Ordered Stop   08/30/11 1400   piperacillin-tazobactam (ZOSYN) IVPB 3.375 g        3.375 g 12.5 mL/hr over 240 Minutes Intravenous 3 times per day 08/30/11 1036     08/29/11 0600   piperacillin-tazobactam (ZOSYN) IVPB 2.25 g  Status:  Discontinued        2.25 g 100 mL/hr over 30 Minutes Intravenous 4 times per day 08/29/11 0425 08/30/11 1036   08/28/11 2345   piperacillin-tazobactam (ZOSYN) IVPB 3.375 g        3.375 g 12.5 mL/hr over 240 Minutes Intravenous NOW 08/28/11 2337 08/29/11 0718   08/28/11 2315   vancomycin (VANCOCIN) IVPB 1000 mg/200 mL premix  Status:   Discontinued        1,000 mg 200 mL/hr over 60 Minutes Intravenous  Once 08/28/11 2301 08/28/11 2325   08/28/11 2315   piperacillin-tazobactam (ZOSYN) IVPB 3.375 g  Status:  Discontinued        3.375 g 12.5 mL/hr over 240 Minutes Intravenous  Once 08/28/11 2301 08/28/11 2325          Assessment/Plan: s/p Procedure(s): EXPLORATORY LAPAROTOMY/ lysis of adhesions OK to transfer to floor to medicine service Awaiting bowel function - continue NG tube, OJ over ice chips D/c Foley Elastic abdominal binder Begin ambulation in halls.  LOS: 4 days    Romina Divirgilio 09/01/2011

## 2011-09-02 ENCOUNTER — Inpatient Hospital Stay (HOSPITAL_COMMUNITY): Payer: Medicare Other

## 2011-09-02 LAB — COMPREHENSIVE METABOLIC PANEL
ALT: 11 U/L (ref 0–35)
AST: 22 U/L (ref 0–37)
Albumin: 2.4 g/dL — ABNORMAL LOW (ref 3.5–5.2)
Alkaline Phosphatase: 73 U/L (ref 39–117)
BUN: 7 mg/dL (ref 6–23)
Chloride: 106 mEq/L (ref 96–112)
Potassium: 5.2 mEq/L — ABNORMAL HIGH (ref 3.5–5.1)
Sodium: 140 mEq/L (ref 135–145)
Total Bilirubin: 0.4 mg/dL (ref 0.3–1.2)
Total Protein: 5.5 g/dL — ABNORMAL LOW (ref 6.0–8.3)

## 2011-09-02 LAB — CBC
HCT: 37 % (ref 36.0–46.0)
MCHC: 32.4 g/dL (ref 30.0–36.0)
Platelets: 336 10*3/uL (ref 150–400)
RDW: 13.8 % (ref 11.5–15.5)
WBC: 11.8 10*3/uL — ABNORMAL HIGH (ref 4.0–10.5)

## 2011-09-02 LAB — GLUCOSE, CAPILLARY
Glucose-Capillary: 99 mg/dL (ref 70–99)
Glucose-Capillary: 86 mg/dL (ref 70–99)

## 2011-09-02 MED ORDER — ENALAPRILAT 1.25 MG/ML IV SOLN
1.2500 mg | Freq: Four times a day (QID) | INTRAVENOUS | Status: DC
  Administered 2011-09-02 – 2011-09-03 (×4): 1.25 mg via INTRAVENOUS
  Filled 2011-09-02 (×7): qty 1

## 2011-09-02 NOTE — Progress Notes (Signed)
Patient ID: Lori [name], female   DOB: [birth year], 70 y.o.   MRN: [id number] Patient ID: Lori [name], female   DOB: [birth year], 70 y.o.   MRN: [id number] Subjective: Patient seen.Claimed she was started on po feeds when she threatened to leave AMA.Risk of starting po feeds prematurely was explained to the patient by the physician and she verbalize understanding.Presently denies any complaints and has not moved her bowel or passed flatus  Objective: Weight change:   Intake/Output Summary (Last 24 hours) at 09/02/11 1620 Last data filed at 09/02/11 1618  Gross per 24 hour  Intake   1560 ml  Output   1790 ml  Net   -230 ml   BP 153/81  Pulse 97  Temp(Src) 98.1 F (36.7 C) (Oral)  Resp 18  Ht 5\' 6"  (1.676 m)  Wt 53.3 kg (117 lb 8.1 oz)  BMI 18.97 kg/m2  SpO2 99% Physical Exam: General appearance: alert, cooperative and no distress,pallor,ng tube in situ Head: Normocephalic, without obvious abnormality, atraumatic Neck: no adenopathy, no carotid bruit, no JVD, supple, symmetrical, trachea midline and thyroid not enlarged, symmetric, no tenderness/mass/nodules Lungs: minimally scattered rales and rhonchi all over the lung field Heart: regular rate and rhythm, S1, S2 normal, no murmur, click, rub or gallop Abdomen: soft, non-tender; no bowel sounds present; no masses,  no organomegaly Extremities: extremities normal, atraumatic, no cyanosis or edema Skin: Skin color, texture, turgor normal. No rashes or lesions  Lab Results: Results for orders placed during the hospital encounter of 08/28/11 (from the past 48 hour(s))  GLUCOSE, CAPILLARY     Status: Normal   Collection Time   08/30/11  5:13 PM      Component Value Range Comment   Glucose-Capillary 80  70 - 99 (mg/dL)   GLUCOSE, CAPILLARY     Status: Abnormal   Collection Time   08/30/11  9:13 PM      Component Value Range Comment   Glucose-Capillary 67 (*) 70 - 99 (mg/dL)   GLUCOSE, CAPILLARY     Status: Abnormal   Collection Time   08/30/11 11:45 PM      Component Value Range Comment   Glucose-Capillary 64 (*) 70 - 99 (mg/dL)   GLUCOSE, CAPILLARY     Status: Abnormal   Collection Time   08/31/11 12:08 AM      Component Value Range Comment   Glucose-Capillary 66 (*) 70 - 99 (mg/dL)   GLUCOSE, CAPILLARY     Status: Normal   Collection Time   08/31/11  1:22 AM      Component Value Range Comment   Glucose-Capillary 84  70 - 99 (mg/dL)   GLUCOSE, CAPILLARY     Status: Normal   Collection Time   08/31/11  4:23 AM      Component Value Range Comment   Glucose-Capillary 77  70 - 99 (mg/dL)   BASIC METABOLIC PANEL     Status: Abnormal   Collection Time   08/31/11  4:30 AM      Component Value Range Comment   Sodium 135  135 - 145 (mEq/L)    Potassium 4.0  3.5 - 5.1 (mEq/L)    Chloride 100  96 - 112 (mEq/L)    CO2 27  19 - 32 (mEq/L)    Glucose, Bld 78  70 - 99 (mg/dL)    BUN 13  6 - 23 (mg/dL)    Creatinine, Ser 0.86  0.50 - 1.10 (mg/dL)    Calcium 8.3 (*) 8.4 - 10.5 (  mg/dL)    GFR calc non Af Amer 88 (*) >90 (mL/min)    GFR calc Af Amer >90  >90 (mL/min)   CBC     Status: Abnormal   Collection Time   08/31/11  4:30 AM      Component Value Range Comment   WBC 14.0 (*) 4.0 - 10.5 (K/uL)    RBC 3.65 (*) 3.87 - 5.11 (MIL/uL)    Hemoglobin 12.6  12.0 - 15.0 (g/dL)    HCT 16.1  09.6 - 04.5 (%)    MCV 104.9 (*) 78.0 - 100.0 (fL)    MCH 34.5 (*) 26.0 - 34.0 (pg)    MCHC 32.9  30.0 - 36.0 (g/dL)    RDW 40.9  81.1 - 91.4 (%)    Platelets 280  150 - 400 (K/uL)   GLUCOSE, CAPILLARY     Status: Normal   Collection Time   08/31/11  7:56 AM      Component Value Range Comment   Glucose-Capillary 70  70 - 99 (mg/dL)    Comment 1 Notify RN     GLUCOSE, CAPILLARY     Status: Normal   Collection Time   08/31/11 11:48 AM      Component Value Range Comment   Glucose-Capillary 73  70 - 99 (mg/dL)   GLUCOSE, CAPILLARY     Status: Normal   Collection Time   08/31/11  4:14 PM      Component Value  Range Comment   Glucose-Capillary 75  70 - 99 (mg/dL)   BASIC METABOLIC PANEL     Status: Abnormal   Collection Time   09/01/11  6:23 AM      Component Value Range Comment   Sodium 138  135 - 145 (mEq/L)    Potassium 6.1 (*) 3.5 - 5.1 (mEq/L) NO VISIBLE HEMOLYSIS   Chloride 102  96 - 112 (mEq/L)    CO2 26  19 - 32 (mEq/L)    Glucose, Bld 72  70 - 99 (mg/dL)    BUN 10  6 - 23 (mg/dL)    Creatinine, Ser 7.82  0.50 - 1.10 (mg/dL)    Calcium 8.8  8.4 - 10.5 (mg/dL)    GFR calc non Af Amer 88 (*) >90 (mL/min)    GFR calc Af Amer >90  >90 (mL/min)   CBC     Status: Abnormal   Collection Time   09/01/11  6:23 AM      Component Value Range Comment   WBC 13.0 (*) 4.0 - 10.5 (K/uL)    RBC 3.58 (*) 3.87 - 5.11 (MIL/uL)    Hemoglobin 12.3  12.0 - 15.0 (g/dL)    HCT 95.6  21.3 - 08.6 (%)    MCV 106.1 (*) 78.0 - 100.0 (fL)    MCH 34.4 (*) 26.0 - 34.0 (pg)    MCHC 32.4  30.0 - 36.0 (g/dL)    RDW 57.8  46.9 - 62.9 (%)    Platelets 326  150 - 400 (K/uL)   GLUCOSE, CAPILLARY     Status: Abnormal   Collection Time   09/01/11  8:11 AM      Component Value Range Comment   Glucose-Capillary 69 (*) 70 - 99 (mg/dL)    Comment 1 Documented in Chart      Comment 2 Notify RN     GLUCOSE, CAPILLARY     Status: Normal   Collection Time   09/01/11 11:30 AM      Component Value  Range Comment   Glucose-Capillary 73  70 - 99 (mg/dL)    Comment 1 Documented in Chart      Comment 2 Notify RN       Micro Results: Recent Results (from the past 240 hour(s))  CULTURE, BLOOD (ROUTINE X 2)     Status: Normal (Preliminary result)   Collection Time   08/28/11  6:20 PM      Component Value Range Status Comment   Specimen Description BLOOD ARM RIGHT   Final    Special Requests BOTTLES DRAWN AEROBIC AND ANAEROBIC 10CC   Final    Setup Time [id number]   Final    Culture     Final    Value:        BLOOD CULTURE RECEIVED NO GROWTH TO DATE CULTURE WILL BE HELD FOR 5 DAYS BEFORE ISSUING A FINAL NEGATIVE REPORT    Report Status PENDING   Incomplete   CULTURE, BLOOD (ROUTINE X 2)     Status: Normal (Preliminary result)   Collection Time   08/28/11  6:35 PM      Component Value Range Status Comment   Specimen Description BLOOD ARM RIGHT   Final    Special Requests BOTTLES DRAWN AEROBIC AND ANAEROBIC 10CC EACH   Final    Setup Time [phone]   Final    Culture     Final    Value:        BLOOD CULTURE RECEIVED NO GROWTH TO DATE CULTURE WILL BE HELD FOR 5 DAYS BEFORE ISSUING A FINAL NEGATIVE REPORT   Report Status PENDING   Incomplete   CULTURE, BLOOD (ROUTINE X 2)     Status: Normal (Preliminary result)   Collection Time   08/29/11 12:15 AM      Component Value Range Status Comment   Specimen Description BLOOD RIGHT ARM   Final    Special Requests     Final    Value: BOTTLES DRAWN AEROBIC AND ANAEROBIC 8CC AEROBIC 4CC ANAEROBIC   Setup Time [id number]   Final    Culture     Final    Value:        BLOOD CULTURE RECEIVED NO GROWTH TO DATE CULTURE WILL BE HELD FOR 5 DAYS BEFORE ISSUING A FINAL NEGATIVE REPORT   Report Status PENDING   Incomplete   CULTURE, BLOOD (ROUTINE X 2)     Status: Normal (Preliminary result)   Collection Time   08/29/11 12:20 AM      Component Value Range Status Comment   Specimen Description BLOOD LEFT ARM   Final    Special Requests BOTTLES DRAWN AEROBIC AND ANAEROBIC 10CC EACH   Final    Setup Time [id number]   Final    Culture     Final    Value:        BLOOD CULTURE RECEIVED NO GROWTH TO DATE CULTURE WILL BE HELD FOR 5 DAYS BEFORE ISSUING A FINAL NEGATIVE REPORT   Report Status PENDING   Incomplete   MRSA PCR SCREENING     Status: Normal   Collection Time   08/29/11  4:17 AM      Component Value Range Status Comment   MRSA by PCR NEGATIVE  NEGATIVE  Final     Studies/Results: Ct Abdomen Pelvis Wo Contrast  08/28/2011  *RADIOLOGY REPORT*  Clinical Data: The abdominal pain.  Nausea and vomiting.  Green bowel for 1 week.  Distended abdomen.  Right-sided  back pain and weakness.  Elevated creatinine.  CT ABDOMEN AND PELVIS WITHOUT CONTRAST  Technique:  Multidetector CT imaging of the abdomen and pelvis was performed following the standard protocol without intravenous contrast.  Comparison: PET CT 10/19/2009  Findings: Focal consolidation in the left lung base posteriorly suggesting pneumonia.  Mild rotation pattern with jejunum on the right side of the abdomen.  The stomach and proximal small bowel are markedly distended with contrast material.  Decompressed ileum. The transition zone appears to be low in the anterior pelvis.  No mass lesion is demonstrated.  Changes may represent adhesion or torsion.  Changes are consistent with high-grade small bowel obstruction.  Calcified granulomas in the spleen.  The liver, gallbladder, and pancreas are unremarkable.  Calcifications in the abdominal aorta without aneurysm.  Calcifications in the splenic artery.  The adrenal glands and kidneys are unremarkable.  No free fluid or free air in the abdomen.  No evidence of significant bowel wall thickening.  Pelvis:  The bladder wall is not thickened.  No free or loculated pelvic fluid collections.  The colon is mostly decompressed with stool present.  No significant colonic wall thickening or inflammatory change.  Diverticulosis of the sigmoid region.  The appendix is not visualized.  Degenerative changes in the lumbar spine.  Compression of the L1 vertebra.  This is stable since previous chest CT dated 03/20/2011.  IMPRESSION: High-grade small bowel obstruction at the level of the distal jejunum with transition zone noted in the lower anterior pelvis. 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  Changes may represent volvulus.  No evidence of bowel wall thickening, pneumatosis, free fluid, or free air.  Results discussed with Dr. Preston Fleeting at the time of dictation, 2131 hours on 08/28/2011.  Original Report Authenticated By: Marlon Pel, M.D.   Ct Head Wo Contrast  08/28/2011  *RADIOLOGY REPORT*  Clinical Data: The patient fell and hit head 2 weeks ago.  CT HEAD WITHOUT CONTRAST  Technique:  Contiguous axial images were obtained from the base of the skull through the vertex without contrast.  Comparison: 04/22/2008  Findings: Diffuse cerebral atrophy.  Low attenuation changes in the deep white matter consistent small vessel ischemic change.  No mass effect or midline shift.  No abnormal extra-axial fluid collections.  Gray-white matter junctions are distinct.  Basal cisterns are not effaced.  No evidence of acute intracranial hemorrhage.  Vascular calcifications in the intracranial arteries. No depressed skull fractures.  Visualized mastoid air cells are not opacified.  Opacification of the right ethmoid air cells and right maxillary antrum, progressing since the previous study, most consistent with inflammatory process.  No significant change in the intracranial contents since the previous study.  IMPRESSION: No evidence of acute intracranial hemorrhage, mass lesion, or acute infarct.  Opacification of right ethmoid air cells and maxillary antra.  Original Report Authenticated By: Marlon Pel, M.D.   Dg Chest Port 1 View  08/29/2011  *RADIOLOGY REPORT*  Clinical Data: Central line placement.  PORTABLE CHEST - 1 VIEW  Comparison: Chest x-ray 08/28/2011.  Findings: The left subclavian central venous catheter tip is in the mid SVC.  No complicating features.  The NG tube is coursing down the esophagus and into the stomach.  The cardiac silhouette, mediastinal and hilar contours are stable.  Stable surgical and radiation changes involving the right hilum and stable left basilar scarring.  IMPRESSION: Left subclavian center venous catheter in good position.  The tip is in the mid SVC.  No complicating features.  Original Report Authenticated By: P. Loralie Champagne, M.D.   Dg Chest Port 1 View  08/28/2011  *RADIOLOGY REPORT*   Clinical Data: Shortness of breath.  Low blood pressure.  Lung cancer post therapy.  PORTABLE CHEST - 1 VIEW  Comparison: 02/22/2010 chest x-ray.  03/20/2011 CT.  Findings: Fullness of the right hilum appears slightly more prominent than on the prior chest x-ray.  This may be related to differences in technique although recurrent tumor at this level is not excluded.  Left base infiltrate/atelectasis new since prior examination.  Central pulmonary vascular prominence without pulmonary edema.  Calcified mildly tortuous aorta.  Remote right humeral neck fracture.  Abnormal small bowel loops along the right hemidiaphragm measuring up to 4 cm.  If there is clinical suspicion for primary abdominal/small bowel abnormality, follow-up imaging whether by plain film or CT recommended.  IMPRESSION: Fullness of the right hilum appears slightly more prominent than on the prior chest x-ray.  This may be related to differences in technique although recurrent tumor at this level is not excluded.  Left base infiltrate/atelectasis new since prior examination.  Abnormal dilated gas-filled small bowel loops.  Results discussed with Dr.Steinl.  Original Report Authenticated By: Fuller Canada, M.D.   Dg Abd Acute W/chest  08/30/2011  *RADIOLOGY REPORT*  Clinical Data: Follow up small bowel obstruction, abdominal pain  ACUTE ABDOMEN SERIES (ABDOMEN 2 VIEW & CHEST 1 VIEW)  Comparison: CT abdomen pelvis of 08/28/2011 and portable chest x- ray of 08/28/2011  Findings: There is still opacity at the left lung base, suspicious for pneumonia.  The lungs are hyperaerated consistent with COPD. Prominent hilar structures remain possibly reflecting a degree of pulmonary arterial hypertension in this patient with COPD.  The left central venous line is noted with the tip in the mid SVC and no pneumothorax is seen.  An NG tube is present.  Portable supine and left lateral decubitus film of the abdomen show little change in the high-grade small  bowel obstruction with gaseous distention of small bowel loops and possible mild edema of small bowel.  However on the decubitus film, no free air is seen. The bones are osteopenic.  An NG tube is present with the tip in the body of the stomach.  IMPRESSION:  1.  No significant change in high-grade small bowel obstruction. No free air. 2.  Opacity remains medially at the left lung base suspicious for pneumonia. 3.  NG tube tip in mid body of stomach. 4.  Left central venous line tip in mid SVC. 5.  COPD.  Original Report Authenticated By: Juline Patch, M.D.   Medications: Scheduled Meds:    . antiseptic oral rinse  15 mL Mouth Rinse q12n4p  . chlorhexidine  15 mL Mouth Rinse BID  . enalaprilat  0.625 mg Intravenous Q6H  . enoxaparin (LOVENOX) injection  40 mg Subcutaneous Q24H  . folic acid  1 mg Intravenous Daily  . metronidazole  500 mg Intravenous Q8H  . pantoprazole (PROTONIX) IV  40 mg Intravenous Q24H  . piperacillin-tazobactam (ZOSYN)  IV  3.375 g Intravenous Q8H  . thiamine  100 mg Intravenous Daily   Continuous Infusions:    . sodium chloride 20 mL/hr (08/30/11 0512)  . 0.9 % NaCl with KCl 40 mEq / L 75 mL/hr at 09/02/11 0227   PRN Meds:.albuterol, HYDROmorphone (DILAUDID) injection, ipratropium, ondansetron (ZOFRAN) IV  Assessment/Plan: #1 s/p Exploratory lap with adhesiolysis with ng tube in situ-mx per surgery.will also continue  zosyn and flagyl #2 ?copd-will continue nebs treatment #3 Anemia-monitor hgb level #4 HTN-Bp control with iv vasotec    LOS: 5 days   Tiarna Koppen 09/02/2011, 4:20 PM

## 2011-09-02 NOTE — Progress Notes (Signed)
Pharmacy - Zosyn   Day 5 of Zosyn treatment for intra-abdominal sepsis Scr stable Afebrile  Plan: 1) Continue Zosyn 3.375 Grams iv Q 8 hours (4 hour infusion) 2) Pharmacy to sign off  Thank you.  Okey Regal, PharmD

## 2011-09-02 NOTE — Progress Notes (Signed)
Patient ID: Lori [name], female   DOB: 12/14/1940, 70 y.o.   MRN: [id number] 3 Days Post-Op  Subjective: Patient without complaints.  Denies flatus.  Still with high output from NG Patient demanding to start oral intake or she says she will sign out AMA  Objective: Vital signs in last 24 hours: Temp:  [97.4 F (36.3 C)-97.9 F (36.6 C)] 97.9 F (36.6 C) (12/15 0900) Pulse Rate:  [84-102] 102  (12/15 0900) Resp:  [18-20] 19  (12/15 0900) BP: (130-159)/(82-92) 133/82 mmHg (12/15 0900) SpO2:  [98 %-100 %] 98 % (12/15 0900) Weight:  [117 lb 8.1 oz (53.3 kg)] 117 lb 8.1 oz (53.3 kg) (12/14 1943) Last BM Date:  (prior to admission)  Intake/Output this shift: Total I/O In: 120 [P.O.:120] Out: 725 [Urine:725]  Physical Exam: BP 133/82  Pulse 102  Temp(Src) 97.9 F (36.6 C) (Oral)  Resp 19  Ht 5\' 6"  (1.676 m)  Wt 117 lb 8.1 oz (53.3 kg)  BMI 18.97 kg/m2  SpO2 98% Abdomen still quiet, but soft. NG output still thcik  Labs: CBC  Basename 09/02/11 0630 09/01/11 0623  WBC 11.8* 13.0*  HGB 12.0 12.3  HCT 37.0 38.0  PLT 336 326   BMET  Basename 09/02/11 0630 09/01/11 0623  NA 140 138  K 5.2* 6.1*  CL 106 102  CO2 25 26  GLUCOSE 79 72  BUN 7 10  CREATININE 0.63 0.67  CALCIUM 8.7 8.8   LFT  Basename 09/02/11 0630  PROT 5.5*  ALBUMIN 2.4*  AST 22  ALT 11  ALKPHOS 73  BILITOT 0.4  BILIDIR --  IBILI --  LIPASE --   PT/INR No results found for this basename: LABPROT:2,INR:2 in the last 72 hours ABG No results found for this basename: PHART:2,PCO2:2,PO2:2,HCO3:2 in the last 72 hours  Studies/Results: Dg Chest 1 View  09/02/2011  *RADIOLOGY REPORT*  Clinical Data: Shortness of breath.  CHEST - 1 VIEW  Comparison: Plain film chest 08/29/2011.  CT chest 03/20/2011.  Findings: NG tube and left subclavian catheter remain in place. The lungs are emphysematous.  There has been some increase in left basilar airspace disease.  Fullness of the right hilum is  unchanged.  Right lung is clear.  Heart size normal.  IMPRESSION: Increased left basilar airspace disease likely due to atelectasis. No other change.  Original Report Authenticated By: Bernadene Bell. [name], M.D.    Assessment: Active Problems:  * No active hospital problems. *    Procedure(s): EXPLORATORY LAPAROTOMY  Plan: I explained at length to her that she has a significant risk of aspiration given she still has an ileus.  She and her family still demand she at least has broth with NG in or she will leave the hospital. I explained the significant risks again of aspiration and death, but they are have not changed her mind  LOS: 5 days    Lori [name] A 09/02/2011

## 2011-09-03 LAB — COMPREHENSIVE METABOLIC PANEL
ALT: 13 U/L (ref 0–35)
AST: 21 U/L (ref 0–37)
Albumin: 2.3 g/dL — ABNORMAL LOW (ref 3.5–5.2)
Calcium: 8.5 mg/dL (ref 8.4–10.5)
Chloride: 105 mEq/L (ref 96–112)
Creatinine, Ser: 0.59 mg/dL (ref 0.50–1.10)
Sodium: 138 mEq/L (ref 135–145)

## 2011-09-03 LAB — CBC
MCV: 104.5 fL — ABNORMAL HIGH (ref 78.0–100.0)
Platelets: 345 10*3/uL (ref 150–400)
RBC: 3.3 MIL/uL — ABNORMAL LOW (ref 3.87–5.11)
RDW: 13.6 % (ref 11.5–15.5)
WBC: 10.2 10*3/uL (ref 4.0–10.5)

## 2011-09-03 LAB — GLUCOSE, CAPILLARY
Glucose-Capillary: 97 mg/dL (ref 70–99)
Glucose-Capillary: 83 mg/dL (ref 70–99)
Glucose-Capillary: 83 mg/dL (ref 70–99)
Glucose-Capillary: 151 mg/dL — ABNORMAL HIGH (ref 70–99)
Glucose-Capillary: 129 mg/dL — ABNORMAL HIGH (ref 70–99)

## 2011-09-03 MED ORDER — LISINOPRIL 10 MG PO TABS
10.0000 mg | ORAL_TABLET | Freq: Every day | ORAL | Status: DC
  Administered 2011-09-03 – 2011-09-05 (×3): 10 mg via ORAL
  Filled 2011-09-03 (×3): qty 1

## 2011-09-03 MED ORDER — IPRATROPIUM BROMIDE 0.02 % IN SOLN: 0.5000 mg | RESPIRATORY_TRACT | Status: DC | PRN

## 2011-09-03 MED ORDER — SODIUM CHLORIDE 0.9 % IJ SOLN
10.0000 mL | INTRAMUSCULAR | Status: DC | PRN
  Administered 2011-09-03 (×2): 10 mL

## 2011-09-03 MED ORDER — SODIUM CHLORIDE 0.9 % IJ SOLN
10.0000 mL | Freq: Two times a day (BID) | INTRAMUSCULAR | Status: DC
  Administered 2011-09-03 – 2011-09-04 (×3): 10 mL

## 2011-09-03 MED ORDER — ALBUTEROL SULFATE (5 MG/ML) 0.5% IN NEBU: 2.5000 mg | INHALATION_SOLUTION | RESPIRATORY_TRACT | Status: DC | PRN

## 2011-09-03 NOTE — Progress Notes (Signed)
Patient ID: Lori [name], female   DOB: 1941/01/06, 70 y.o.   MRN: [id number] Patient ID: Lori [name], female   DOB: 1941-04-29, 70 y.o.   MRN: [id number] Patient ID: Lori [name], female   DOB: 07-01-1941, 70 y.o.   MRN: [id number] Subjective: Patient seen.Ng tube removed and patient tolerating po feeds.  Objective: Weight change: -1.2 kg (-2 lb 10.3 oz)  Intake/Output Summary (Last 24 hours) at 09/03/11 1636 Last data filed at 09/03/11 1521  Gross per 24 hour  Intake    930 ml  Output   6625 ml  Net  -5695 ml   BP 101/54  Pulse 88  Temp(Src) 98.6 F (37 C) (Oral)  Resp 18  Ht 5\' 6"  (1.676 m)  Wt 52.1 kg (114 lb 13.8 oz)  BMI 18.54 kg/m2  SpO2 98% Physical Exam: General appearance: alert, cooperative and no distress,pallor,ng tube in situ Head: Normocephalic, without obvious abnormality, atraumatic Neck: no adenopathy, no carotid bruit, no JVD, supple, symmetrical, trachea midline and thyroid not enlarged, symmetric, no tenderness/mass/nodules Lungs: minimally bibasal crackles Heart: regular rate and rhythm, S1, S2 normal, no murmur, click, rub or gallop Abdomen: soft, non-tender; no bowel sounds present; no masses,  no organomegaly Extremities: extremities normal, atraumatic, no cyanosis or edema Skin: Skin color, texture, turgor normal. No rashes or lesions  Lab Results: Results for orders placed during the hospital encounter of 08/28/11 (from the past 48 hour(s))  GLUCOSE, CAPILLARY     Status: Normal   Collection Time   08/30/11  5:13 PM      Component Value Range Comment   Glucose-Capillary 80  70 - 99 (mg/dL)   GLUCOSE, CAPILLARY     Status: Abnormal   Collection Time   08/30/11  9:13 PM      Component Value Range Comment   Glucose-Capillary 67 (*) 70 - 99 (mg/dL)   GLUCOSE, CAPILLARY     Status: Abnormal   Collection Time   08/30/11 11:45 PM      Component Value Range Comment   Glucose-Capillary 64 (*) 70 - 99 (mg/dL)   GLUCOSE, CAPILLARY     Status:  Abnormal   Collection Time   08/31/11 12:08 AM      Component Value Range Comment   Glucose-Capillary 66 (*) 70 - 99 (mg/dL)   GLUCOSE, CAPILLARY     Status: Normal   Collection Time   08/31/11  1:22 AM      Component Value Range Comment   Glucose-Capillary 84  70 - 99 (mg/dL)   GLUCOSE, CAPILLARY     Status: Normal   Collection Time   08/31/11  4:23 AM      Component Value Range Comment   Glucose-Capillary 77  70 - 99 (mg/dL)   BASIC METABOLIC PANEL     Status: Abnormal   Collection Time   08/31/11  4:30 AM      Component Value Range Comment   Sodium 135  135 - 145 (mEq/L)    Potassium 4.0  3.5 - 5.1 (mEq/L)    Chloride 100  96 - 112 (mEq/L)    CO2 27  19 - 32 (mEq/L)    Glucose, Bld 78  70 - 99 (mg/dL)    BUN 13  6 - 23 (mg/dL)    Creatinine, Ser 0.86  0.50 - 1.10 (mg/dL)    Calcium 8.3 (*) 8.4 - 10.5 (mg/dL)    GFR calc non Af Amer 88 (*) >90 (mL/min)    GFR calc Af Amer >  90  >90 (mL/min)   CBC     Status: Abnormal   Collection Time   08/31/11  4:30 AM      Component Value Range Comment   WBC 14.0 (*) 4.0 - 10.5 (K/uL)    RBC 3.65 (*) 3.87 - 5.11 (MIL/uL)    Hemoglobin 12.6  12.0 - 15.0 (g/dL)    HCT 16.1  09.6 - 04.5 (%)    MCV 104.9 (*) 78.0 - 100.0 (fL)    MCH 34.5 (*) 26.0 - 34.0 (pg)    MCHC 32.9  30.0 - 36.0 (g/dL)    RDW 40.9  81.1 - 91.4 (%)    Platelets 280  150 - 400 (K/uL)   GLUCOSE, CAPILLARY     Status: Normal   Collection Time   08/31/11  7:56 AM      Component Value Range Comment   Glucose-Capillary 70  70 - 99 (mg/dL)    Comment 1 Notify RN     GLUCOSE, CAPILLARY     Status: Normal   Collection Time   08/31/11 11:48 AM      Component Value Range Comment   Glucose-Capillary 73  70 - 99 (mg/dL)   GLUCOSE, CAPILLARY     Status: Normal   Collection Time   08/31/11  4:14 PM      Component Value Range Comment   Glucose-Capillary 75  70 - 99 (mg/dL)   BASIC METABOLIC PANEL     Status: Abnormal   Collection Time   09/01/11  6:23 AM       Component Value Range Comment   Sodium 138  135 - 145 (mEq/L)    Potassium 6.1 (*) 3.5 - 5.1 (mEq/L) NO VISIBLE HEMOLYSIS   Chloride 102  96 - 112 (mEq/L)    CO2 26  19 - 32 (mEq/L)    Glucose, Bld 72  70 - 99 (mg/dL)    BUN 10  6 - 23 (mg/dL)    Creatinine, Ser 7.82  0.50 - 1.10 (mg/dL)    Calcium 8.8  8.4 - 10.5 (mg/dL)    GFR calc non Af Amer 88 (*) >90 (mL/min)    GFR calc Af Amer >90  >90 (mL/min)   CBC     Status: Abnormal   Collection Time   09/01/11  6:23 AM      Component Value Range Comment   WBC 13.0 (*) 4.0 - 10.5 (K/uL)    RBC 3.58 (*) 3.87 - 5.11 (MIL/uL)    Hemoglobin 12.3  12.0 - 15.0 (g/dL)    HCT 95.6  21.3 - 08.6 (%)    MCV 106.1 (*) 78.0 - 100.0 (fL)    MCH 34.4 (*) 26.0 - 34.0 (pg)    MCHC 32.4  30.0 - 36.0 (g/dL)    RDW 57.8  46.9 - 62.9 (%)    Platelets 326  150 - 400 (K/uL)   GLUCOSE, CAPILLARY     Status: Abnormal   Collection Time   09/01/11  8:11 AM      Component Value Range Comment   Glucose-Capillary 69 (*) 70 - 99 (mg/dL)    Comment 1 Documented in Chart      Comment 2 Notify RN     GLUCOSE, CAPILLARY     Status: Normal   Collection Time   09/01/11 11:30 AM      Component Value Range Comment   Glucose-Capillary 73  70 - 99 (mg/dL)    Comment 1 Documented in Chart  Comment 2 Notify Investment banker, operational Results: Recent Results (from the past 240 hour(s))  CULTURE, BLOOD (ROUTINE X 2)     Status: Normal (Preliminary result)   Collection Time   08/28/11  6:20 PM      Component Value Range Status Comment   Specimen Description BLOOD ARM RIGHT   Final    Special Requests BOTTLES DRAWN AEROBIC AND ANAEROBIC 10CC   Final    Setup Time [id number]   Final    Culture     Final    Value:        BLOOD CULTURE RECEIVED NO GROWTH TO DATE CULTURE WILL BE HELD FOR 5 DAYS BEFORE ISSUING A FINAL NEGATIVE REPORT   Report Status PENDING   Incomplete   CULTURE, BLOOD (ROUTINE X 2)     Status: Normal (Preliminary result)   Collection Time   08/28/11  6:35  PM      Component Value Range Status Comment   Specimen Description BLOOD ARM RIGHT   Final    Special Requests BOTTLES DRAWN AEROBIC AND ANAEROBIC 10CC EACH   Final    Setup Time [phone]   Final    Culture     Final    Value:        BLOOD CULTURE RECEIVED NO GROWTH TO DATE CULTURE WILL BE HELD FOR 5 DAYS BEFORE ISSUING A FINAL NEGATIVE REPORT   Report Status PENDING   Incomplete   CULTURE, BLOOD (ROUTINE X 2)     Status: Normal (Preliminary result)   Collection Time   08/29/11 12:15 AM      Component Value Range Status Comment   Specimen Description BLOOD RIGHT ARM   Final    Special Requests     Final    Value: BOTTLES DRAWN AEROBIC AND ANAEROBIC 8CC AEROBIC 4CC ANAEROBIC   Setup Time [id number]   Final    Culture     Final    Value:        BLOOD CULTURE RECEIVED NO GROWTH TO DATE CULTURE WILL BE HELD FOR 5 DAYS BEFORE ISSUING A FINAL NEGATIVE REPORT   Report Status PENDING   Incomplete   CULTURE, BLOOD (ROUTINE X 2)     Status: Normal (Preliminary result)   Collection Time   08/29/11 12:20 AM      Component Value Range Status Comment   Specimen Description BLOOD LEFT ARM   Final    Special Requests BOTTLES DRAWN AEROBIC AND ANAEROBIC 10CC EACH   Final    Setup Time [id number]   Final    Culture     Final    Value:        BLOOD CULTURE RECEIVED NO GROWTH TO DATE CULTURE WILL BE HELD FOR 5 DAYS BEFORE ISSUING A FINAL NEGATIVE REPORT   Report Status PENDING   Incomplete   MRSA PCR SCREENING     Status: Normal   Collection Time   08/29/11  4:17 AM      Component Value Range Status Comment   MRSA by PCR NEGATIVE  NEGATIVE  Final     Studies/Results: Ct Abdomen Pelvis Wo Contrast  08/28/2011  *RADIOLOGY REPORT*  Clinical Data: The abdominal pain.  Nausea and vomiting.  Green bowel for 1 week.  Distended abdomen.  Right-sided back pain and weakness.  Elevated creatinine.  CT ABDOMEN AND PELVIS WITHOUT CONTRAST  Technique:  Multidetector CT imaging of the abdomen and pelvis  was performed following the standard protocol  without intravenous contrast.  Comparison: PET CT 10/19/2009  Findings: Focal consolidation in the left lung base posteriorly suggesting pneumonia.  Mild rotation pattern with jejunum on the right side of the abdomen.  The stomach and proximal small bowel are markedly distended with contrast material.  Decompressed ileum. The transition zone appears to be low in the anterior pelvis.  No mass lesion is demonstrated.  Changes may represent adhesion or torsion.  Changes are consistent with high-grade small bowel obstruction.  Calcified granulomas in the spleen.  The liver, gallbladder, and pancreas are unremarkable.  Calcifications in the abdominal aorta without aneurysm.  Calcifications in the splenic artery.  The adrenal glands and kidneys are unremarkable.  No free fluid or free air in the abdomen.  No evidence of significant bowel wall thickening.  Pelvis:  The bladder wall is not thickened.  No free or loculated pelvic fluid collections.  The colon is mostly decompressed with stool present.  No significant colonic wall thickening or inflammatory change.  Diverticulosis of the sigmoid region.  The appendix is not visualized.  Degenerative changes in the lumbar spine.  Compression of the L1 vertebra.  This is stable since previous chest CT dated 03/20/2011.  IMPRESSION: High-grade small bowel obstruction at the level of the distal jejunum with transition zone noted in the lower anterior pelvis. 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  Changes may represent volvulus.  No evidence of bowel wall thickening, pneumatosis, free fluid, or free air.  Results discussed with Dr. Preston Fleeting at the time of dictation, 2131 hours on 08/28/2011.  Original Report Authenticated By: Marlon Pel, M.D.   Ct Head Wo Contrast  08/28/2011  *RADIOLOGY REPORT*  Clinical Data: The patient fell and hit head 2 weeks ago.  CT HEAD WITHOUT  CONTRAST  Technique:  Contiguous axial images were obtained from the base of the skull through the vertex without contrast.  Comparison: 04/22/2008  Findings: Diffuse cerebral atrophy.  Low attenuation changes in the deep white matter consistent small vessel ischemic change.  No mass effect or midline shift.  No abnormal extra-axial fluid collections.  Gray-white matter junctions are distinct.  Basal cisterns are not effaced.  No evidence of acute intracranial hemorrhage.  Vascular calcifications in the intracranial arteries. No depressed skull fractures.  Visualized mastoid air cells are not opacified.  Opacification of the right ethmoid air cells and right maxillary antrum, progressing since the previous study, most consistent with inflammatory process.  No significant change in the intracranial contents since the previous study.  IMPRESSION: No evidence of acute intracranial hemorrhage, mass lesion, or acute infarct.  Opacification of right ethmoid air cells and maxillary antra.  Original Report Authenticated By: Marlon Pel, M.D.   Dg Chest Port 1 View  08/29/2011  *RADIOLOGY REPORT*  Clinical Data: Central line placement.  PORTABLE CHEST - 1 VIEW  Comparison: Chest x-ray 08/28/2011.  Findings: The left subclavian central venous catheter tip is in the mid SVC.  No complicating features.  The NG tube is coursing down the esophagus and into the stomach.  The cardiac silhouette, mediastinal and hilar contours are stable.  Stable surgical and radiation changes involving the right hilum and stable left basilar scarring.  IMPRESSION: Left subclavian center venous catheter in good position.  The tip is in the mid SVC.  No complicating features.  Original Report Authenticated By: P. Loralie Champagne, M.D.   Dg Chest Port 1 View  08/28/2011  *  RADIOLOGY REPORT*  Clinical Data: Shortness of breath.  Low blood pressure.  Lung cancer post therapy.  PORTABLE CHEST - 1 VIEW  Comparison: 02/22/2010 chest x-ray.   03/20/2011 CT.  Findings: Fullness of the right hilum appears slightly more prominent than on the prior chest x-ray.  This may be related to differences in technique although recurrent tumor at this level is not excluded.  Left base infiltrate/atelectasis new since prior examination.  Central pulmonary vascular prominence without pulmonary edema.  Calcified mildly tortuous aorta.  Remote right humeral neck fracture.  Abnormal small bowel loops along the right hemidiaphragm measuring up to 4 cm.  If there is clinical suspicion for primary abdominal/small bowel abnormality, follow-up imaging whether by plain film or CT recommended.  IMPRESSION: Fullness of the right hilum appears slightly more prominent than on the prior chest x-ray.  This may be related to differences in technique although recurrent tumor at this level is not excluded.  Left base infiltrate/atelectasis new since prior examination.  Abnormal dilated gas-filled small bowel loops.  Results discussed with Dr.Steinl.  Original Report Authenticated By: Fuller Canada, M.D.   Dg Abd Acute W/chest  08/30/2011  *RADIOLOGY REPORT*  Clinical Data: Follow up small bowel obstruction, abdominal pain  ACUTE ABDOMEN SERIES (ABDOMEN 2 VIEW & CHEST 1 VIEW)  Comparison: CT abdomen pelvis of 08/28/2011 and portable chest x- ray of 08/28/2011  Findings: There is still opacity at the left lung base, suspicious for pneumonia.  The lungs are hyperaerated consistent with COPD. Prominent hilar structures remain possibly reflecting a degree of pulmonary arterial hypertension in this patient with COPD.  The left central venous line is noted with the tip in the mid SVC and no pneumothorax is seen.  An NG tube is present.  Portable supine and left lateral decubitus film of the abdomen show little change in the high-grade small bowel obstruction with gaseous distention of small bowel loops and possible mild edema of small bowel.  However on the decubitus film, no free air is  seen. The bones are osteopenic.  An NG tube is present with the tip in the body of the stomach.  IMPRESSION:  1.  No significant change in high-grade small bowel obstruction. No free air. 2.  Opacity remains medially at the left lung base suspicious for pneumonia. 3.  NG tube tip in mid body of stomach. 4.  Left central venous line tip in mid SVC. 5.  COPD.  Original Report Authenticated By: Juline Patch, M.D.   Medications: Scheduled Meds:    . antiseptic oral rinse  15 mL Mouth Rinse q12n4p  . chlorhexidine  15 mL Mouth Rinse BID  . enalaprilat  1.25 mg Intravenous Q6H  . enoxaparin (LOVENOX) injection  40 mg Subcutaneous Q24H  . folic acid  1 mg Intravenous Daily  . metronidazole  500 mg Intravenous Q8H  . pantoprazole (PROTONIX) IV  40 mg Intravenous Q24H  . piperacillin-tazobactam (ZOSYN)  IV  3.375 g Intravenous Q8H  . sodium chloride  10 mL Intracatheter Q12H  . thiamine  100 mg Intravenous Daily   Continuous Infusions:    . sodium chloride 20 mL/hr (08/30/11 0512)  . 0.9 % NaCl with KCl 40 mEq / L 75 mL/hr at 09/03/11 1028   PRN Meds:.albuterol, HYDROmorphone (DILAUDID) injection, ipratropium, ondansetron (ZOFRAN) IV, sodium chloride  Assessment/Plan: #1 s/p Exploratory lap with adhesiolysis. Ng tube removed as ordered by surgery.will also continue zosyn and flagyl.wbc count trending down. #2 ?copd-will continue nebs prn #  3 Anemia-monitor hgb level #4 HTN-Bp fairly controlled.Will d/c iv vasotec and start po lisinopril    LOS: 6 days   Decklin Weddington 09/03/2011, 4:36 PM

## 2011-09-03 NOTE — Progress Notes (Signed)
4 Days Post-Op s/p exploratory laparotomy for small bowel obstruction Subjective: Patient reports several episodes of flatus witnessed by family.  Has been on clear liquids, coming back thru NG tube.  Pain is moderately well-controlled.  Objective: Vital signs in last 24 hours: Temp:  [97.9 F (36.6 C)-98.4 F (36.9 C)] 98.4 F (36.9 C) (12/16 0422) Pulse Rate:  [90-102] 92  (12/16 0422) Resp:  [18-20] 18  (12/16 0422) BP: (118-153)/(68-82) 127/68 mmHg (12/16 0422) SpO2:  [97 %-99 %] 97 % (12/16 0422) Weight:  [114 lb 13.8 oz (52.1 kg)] 114 lb 13.8 oz (52.1 kg) (12/15 2101) Last BM Date:  (prior to admission)  Intake/Output from previous day: 12/15 0701 - 12/16 0700 In: 990 [P.O.:840; IV Piggyback:150] Out: 1610 [Urine:1240; Emesis/NG output:5575] Intake/Output this shift:    General appearance: alert, cooperative and no distress GI: soft, active bowel sounds, non-distended Staple line intact - no sign of infection  Lab Results:   [facility] 09/02/11 0630 09/01/11 0623  WBC 11.8* 13.0*  HGB 12.0 12.3  HCT 37.0 38.0  PLT 336 326   BMET  Basename 09/02/11 0630 09/01/11 0623  NA 140 138  K 5.2* 6.1*  CL 106 102  CO2 25 26  GLUCOSE 79 72  BUN 7 10  CREATININE 0.63 0.67  CALCIUM 8.7 8.8   PT/INR No results found for this basename: LABPROT:2,INR:2 in the last 72 hours ABG No results found for this basename: PHART:2,PCO2:2,PO2:2,HCO3:2 in the last 72 hours  Studies/Results: Dg Chest 1 View  09/02/2011  *RADIOLOGY REPORT*  Clinical Data: Shortness of breath.  CHEST - 1 VIEW  Comparison: Plain film chest 08/29/2011.  CT chest 03/20/2011.  Findings: NG tube and left subclavian catheter remain in place. The lungs are emphysematous.  There has been some increase in left basilar airspace disease.  Fullness of the right hilum is unchanged.  Right lung is clear.  Heart size normal.  IMPRESSION: Increased left basilar airspace disease likely due to atelectasis. No other change.   Original Report Authenticated By: Bernadene Bell. [name], M.D.    Anti-infectives: Anti-infectives     Start     Dose/Rate Route Frequency Ordered Stop   09/01/11 1715   metroNIDAZOLE (FLAGYL) IVPB 500 mg        500 mg 100 mL/hr over 60 Minutes Intravenous Every 8 hours 09/01/11 1702     08/30/11 1400   piperacillin-tazobactam (ZOSYN) IVPB 3.375 g        3.375 g 12.5 mL/hr over 240 Minutes Intravenous 3 times per day 08/30/11 1036     08/29/11 0600   piperacillin-tazobactam (ZOSYN) IVPB 2.25 g  Status:  Discontinued        2.25 g 100 mL/hr over 30 Minutes Intravenous 4 times per day 08/29/11 0425 08/30/11 1036   08/28/11 2345   piperacillin-tazobactam (ZOSYN) IVPB 3.375 g        3.375 g 12.5 mL/hr over 240 Minutes Intravenous NOW 08/28/11 2337 08/29/11 0718   08/28/11 2315   vancomycin (VANCOCIN) IVPB 1000 mg/200 mL premix  Status:  Discontinued        1,000 mg 200 mL/hr over 60 Minutes Intravenous  Once 08/28/11 2301 08/28/11 2325   08/28/11 2315   piperacillin-tazobactam (ZOSYN) IVPB 3.375 g  Status:  Discontinued        3.375 g 12.5 mL/hr over 240 Minutes Intravenous  Once 08/28/11 2301 08/28/11 2325          Assessment/Plan: s/p Procedure(s): EXPLORATORY LAPAROTOMY Remove NG tube, continue  clears, ambulate  LOS: 6 days    Garnet Chatmon K. 09/03/2011

## 2011-09-04 DIAGNOSIS — K56609 Unspecified intestinal obstruction, unspecified as to partial versus complete obstruction: Secondary | ICD-10-CM | POA: Diagnosis present

## 2011-09-04 LAB — COMPREHENSIVE METABOLIC PANEL
Albumin: 2.4 g/dL — ABNORMAL LOW (ref 3.5–5.2)
Alkaline Phosphatase: 64 U/L (ref 39–117)
BUN: 4 mg/dL — ABNORMAL LOW (ref 6–23)
Calcium: 8.5 mg/dL (ref 8.4–10.5)
Creatinine, Ser: 0.61 mg/dL (ref 0.50–1.10)
GFR calc Af Amer: >90 mL/min (ref >90)
Glucose, Bld: 86 mg/dL (ref 70–99)
Total Protein: 5.4 g/dL — ABNORMAL LOW (ref 6.0–8.3)

## 2011-09-04 LAB — CULTURE, BLOOD (ROUTINE X 2)
Culture  Setup Time: 201212110251
Culture  Setup Time: 201212110846
Culture: NO GROWTH

## 2011-09-04 LAB — GLUCOSE, CAPILLARY
Glucose-Capillary: 98 mg/dL (ref 70–99)
Glucose-Capillary: 126 mg/dL — ABNORMAL HIGH (ref 70–99)

## 2011-09-04 LAB — CBC
HCT: 35.2 % — ABNORMAL LOW (ref 36.0–46.0)
Hemoglobin: 11.7 g/dL — ABNORMAL LOW (ref 12.0–15.0)
MCH: 34.5 pg — ABNORMAL HIGH (ref 26.0–34.0)
MCHC: 33.2 g/dL (ref 30.0–36.0)
MCV: 103.8 fL — ABNORMAL HIGH (ref 78.0–100.0)
RDW: 13.8 % (ref 11.5–15.5)

## 2011-09-04 NOTE — Progress Notes (Signed)
Patient ID: Lori [name], female   DOB: May 16, 1941, 70 y.o.   MRN: [id number] Patient ID: Lori [name], female   DOB: 1941/06/27, 70 y.o.   MRN: [id number] Patient ID: Lori [name], female   DOB: Oct 20, 1940, 70 y.o.   MRN: [id number] Patient ID: Lori [name], female   DOB: 1940/12/04, 70 y.o.   MRN: [id number] Subjective: Patient seen.Tolerating po feeds.Moved bowel x2 over the night.Demanding for pain medications  Objective: Weight change: 0.2 kg (7.1 oz)  Intake/Output Summary (Last 24 hours) at 09/04/11 0737 Last data filed at 09/04/11 0618  Gross per 24 hour  Intake   1580 ml  Output   1275 ml  Net    305 ml   BP 116/78  Pulse 83  Temp(Src) 98 F (36.7 C) (Oral)  Resp 18  Ht 5\' 6"  (1.676 m)  Wt 52.3 kg (115 lb 4.8 oz)  BMI 18.61 kg/m2  SpO2 98% Physical Exam: General appearance: alert, cooperative and no distress,pallor, Head: Normocephalic, without obvious abnormality, atraumatic Neck: no adenopathy, no carotid bruit, no JVD, supple, symmetrical, trachea midline and thyroid not enlarged, symmetric, no tenderness/mass/nodules Lungs:good air entry bilaterally Heart: regular rate and rhythm, S1, S2 normal, no murmur, click, rub or gallop Abdomen: soft, non-tender; no bowel sounds present; no masses,  no organomegaly Extremities: extremities normal, atraumatic, no cyanosis or edema Skin: Skin color, texture, turgor normal. No rashes or lesions  Lab Results: Results for orders placed during the hospital encounter of 08/28/11 (from the past 48 hour(s))  GLUCOSE, CAPILLARY     Status: Normal   Collection Time   08/30/11  5:13 PM      Component Value Range Comment   Glucose-Capillary 80  70 - 99 (mg/dL)   GLUCOSE, CAPILLARY     Status: Abnormal   Collection Time   08/30/11  9:13 PM      Component Value Range Comment   Glucose-Capillary 67 (*) 70 - 99 (mg/dL)   GLUCOSE, CAPILLARY     Status: Abnormal   Collection Time   08/30/11 11:45 PM      Component Value Range  Comment   Glucose-Capillary 64 (*) 70 - 99 (mg/dL)   GLUCOSE, CAPILLARY     Status: Abnormal   Collection Time   08/31/11 12:08 AM      Component Value Range Comment   Glucose-Capillary 66 (*) 70 - 99 (mg/dL)   GLUCOSE, CAPILLARY     Status: Normal   Collection Time   08/31/11  1:22 AM      Component Value Range Comment   Glucose-Capillary 84  70 - 99 (mg/dL)   GLUCOSE, CAPILLARY     Status: Normal   Collection Time   08/31/11  4:23 AM      Component Value Range Comment   Glucose-Capillary 77  70 - 99 (mg/dL)   BASIC METABOLIC PANEL     Status: Abnormal   Collection Time   08/31/11  4:30 AM      Component Value Range Comment   Sodium 135  135 - 145 (mEq/L)    Potassium 4.0  3.5 - 5.1 (mEq/L)    Chloride 100  96 - 112 (mEq/L)    CO2 27  19 - 32 (mEq/L)    Glucose, Bld 78  70 - 99 (mg/dL)    BUN 13  6 - 23 (mg/dL)    Creatinine, Ser 6.64  0.50 - 1.10 (mg/dL)    Calcium 8.3 (*) 8.4 - 10.5 (mg/dL)    GFR calc  non Af Amer 88 (*) >90 (mL/min)    GFR calc Af Amer >90  >90 (mL/min)   CBC     Status: Abnormal   Collection Time   08/31/11  4:30 AM      Component Value Range Comment   WBC 14.0 (*) 4.0 - 10.5 (K/uL)    RBC 3.65 (*) 3.87 - 5.11 (MIL/uL)    Hemoglobin 12.6  12.0 - 15.0 (g/dL)    HCT 16.1  09.6 - 04.5 (%)    MCV 104.9 (*) 78.0 - 100.0 (fL)    MCH 34.5 (*) 26.0 - 34.0 (pg)    MCHC 32.9  30.0 - 36.0 (g/dL)    RDW 40.9  81.1 - 91.4 (%)    Platelets 280  150 - 400 (K/uL)   GLUCOSE, CAPILLARY     Status: Normal   Collection Time   08/31/11  7:56 AM      Component Value Range Comment   Glucose-Capillary 70  70 - 99 (mg/dL)    Comment 1 Notify RN     GLUCOSE, CAPILLARY     Status: Normal   Collection Time   08/31/11 11:48 AM      Component Value Range Comment   Glucose-Capillary 73  70 - 99 (mg/dL)   GLUCOSE, CAPILLARY     Status: Normal   Collection Time   08/31/11  4:14 PM      Component Value Range Comment   Glucose-Capillary 75  70 - 99 (mg/dL)   BASIC  METABOLIC PANEL     Status: Abnormal   Collection Time   09/01/11  6:23 AM      Component Value Range Comment   Sodium 138  135 - 145 (mEq/L)    Potassium 6.1 (*) 3.5 - 5.1 (mEq/L) NO VISIBLE HEMOLYSIS   Chloride 102  96 - 112 (mEq/L)    CO2 26  19 - 32 (mEq/L)    Glucose, Bld 72  70 - 99 (mg/dL)    BUN 10  6 - 23 (mg/dL)    Creatinine, Ser 7.82  0.50 - 1.10 (mg/dL)    Calcium 8.8  8.4 - 10.5 (mg/dL)    GFR calc non Af Amer 88 (*) >90 (mL/min)    GFR calc Af Amer >90  >90 (mL/min)   CBC     Status: Abnormal   Collection Time   09/01/11  6:23 AM      Component Value Range Comment   WBC 13.0 (*) 4.0 - 10.5 (K/uL)    RBC 3.58 (*) 3.87 - 5.11 (MIL/uL)    Hemoglobin 12.3  12.0 - 15.0 (g/dL)    HCT 95.6  21.3 - 08.6 (%)    MCV 106.1 (*) 78.0 - 100.0 (fL)    MCH 34.4 (*) 26.0 - 34.0 (pg)    MCHC 32.4  30.0 - 36.0 (g/dL)    RDW 57.8  46.9 - 62.9 (%)    Platelets 326  150 - 400 (K/uL)   GLUCOSE, CAPILLARY     Status: Abnormal   Collection Time   09/01/11  8:11 AM      Component Value Range Comment   Glucose-Capillary 69 (*) 70 - 99 (mg/dL)    Comment 1 Documented in Chart      Comment 2 Notify RN     GLUCOSE, CAPILLARY     Status: Normal   Collection Time   09/01/11 11:30 AM      Component Value Range Comment   Glucose-Capillary 73  70 - 99 (mg/dL)    Comment 1 Documented in Chart      Comment 2 Notify RN       Micro Results: Recent Results (from the past 240 hour(s))  CULTURE, BLOOD (ROUTINE X 2)     Status: Normal (Preliminary result)   Collection Time   08/28/11  6:20 PM      Component Value Range Status Comment   Specimen Description BLOOD ARM RIGHT   Final    Special Requests BOTTLES DRAWN AEROBIC AND ANAEROBIC 10CC   Final    Setup Time [id number]   Final    Culture     Final    Value:        BLOOD CULTURE RECEIVED NO GROWTH TO DATE CULTURE WILL BE HELD FOR 5 DAYS BEFORE ISSUING A FINAL NEGATIVE REPORT   Report Status PENDING   Incomplete   CULTURE, BLOOD  (ROUTINE X 2)     Status: Normal (Preliminary result)   Collection Time   08/28/11  6:35 PM      Component Value Range Status Comment   Specimen Description BLOOD ARM RIGHT   Final    Special Requests BOTTLES DRAWN AEROBIC AND ANAEROBIC 10CC EACH   Final    Setup Time [phone]   Final    Culture     Final    Value:        BLOOD CULTURE RECEIVED NO GROWTH TO DATE CULTURE WILL BE HELD FOR 5 DAYS BEFORE ISSUING A FINAL NEGATIVE REPORT   Report Status PENDING   Incomplete   CULTURE, BLOOD (ROUTINE X 2)     Status: Normal (Preliminary result)   Collection Time   08/29/11 12:15 AM      Component Value Range Status Comment   Specimen Description BLOOD RIGHT ARM   Final    Special Requests     Final    Value: BOTTLES DRAWN AEROBIC AND ANAEROBIC 8CC AEROBIC 4CC ANAEROBIC   Setup Time [id number]   Final    Culture     Final    Value:        BLOOD CULTURE RECEIVED NO GROWTH TO DATE CULTURE WILL BE HELD FOR 5 DAYS BEFORE ISSUING A FINAL NEGATIVE REPORT   Report Status PENDING   Incomplete   CULTURE, BLOOD (ROUTINE X 2)     Status: Normal (Preliminary result)   Collection Time   08/29/11 12:20 AM      Component Value Range Status Comment   Specimen Description BLOOD LEFT ARM   Final    Special Requests BOTTLES DRAWN AEROBIC AND ANAEROBIC 10CC EACH   Final    Setup Time [id number]   Final    Culture     Final    Value:        BLOOD CULTURE RECEIVED NO GROWTH TO DATE CULTURE WILL BE HELD FOR 5 DAYS BEFORE ISSUING A FINAL NEGATIVE REPORT   Report Status PENDING   Incomplete   MRSA PCR SCREENING     Status: Normal   Collection Time   08/29/11  4:17 AM      Component Value Range Status Comment   MRSA by PCR NEGATIVE  NEGATIVE  Final     Studies/Results: Ct Abdomen Pelvis Wo Contrast  08/28/2011  *RADIOLOGY REPORT*  Clinical Data: The abdominal pain.  Nausea and vomiting.  Green bowel for 1 week.  Distended abdomen.  Right-sided back pain and weakness.  Elevated creatinine.  CT ABDOMEN  AND PELVIS WITHOUT CONTRAST  Technique:  Multidetector CT imaging of the abdomen and pelvis was performed following the standard protocol without intravenous contrast.  Comparison: PET CT 10/19/2009  Findings: Focal consolidation in the left lung base posteriorly suggesting pneumonia.  Mild rotation pattern with jejunum on the right side of the abdomen.  The stomach and proximal small bowel are markedly distended with contrast material.  Decompressed ileum. The transition zone appears to be low in the anterior pelvis.  No mass lesion is demonstrated.  Changes may represent adhesion or torsion.  Changes are consistent with high-grade small bowel obstruction.  Calcified granulomas in the spleen.  The liver, gallbladder, and pancreas are unremarkable.  Calcifications in the abdominal aorta without aneurysm.  Calcifications in the splenic artery.  The adrenal glands and kidneys are unremarkable.  No free fluid or free air in the abdomen.  No evidence of significant bowel wall thickening.  Pelvis:  The bladder wall is not thickened.  No free or loculated pelvic fluid collections.  The colon is mostly decompressed with stool present.  No significant colonic wall thickening or inflammatory change.  Diverticulosis of the sigmoid region.  The appendix is not visualized.  Degenerative changes in the lumbar spine.  Compression of the L1 vertebra.  This is stable since previous chest CT dated 03/20/2011.  IMPRESSION: High-grade small bowel obstruction at the level of the distal jejunum with transition zone noted in the lower anterior pelvis. 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  Changes may represent volvulus.  No evidence of bowel wall thickening, pneumatosis, free fluid, or free air.  Results discussed with Dr. Preston Fleeting at the time of dictation, 2131 hours on 08/28/2011.  Original Report Authenticated By: Marlon Pel, M.D.   Ct Head Wo Contrast  08/28/2011   *RADIOLOGY REPORT*  Clinical Data: The patient fell and hit head 2 weeks ago.  CT HEAD WITHOUT CONTRAST  Technique:  Contiguous axial images were obtained from the base of the skull through the vertex without contrast.  Comparison: 04/22/2008  Findings: Diffuse cerebral atrophy.  Low attenuation changes in the deep white matter consistent small vessel ischemic change.  No mass effect or midline shift.  No abnormal extra-axial fluid collections.  Gray-white matter junctions are distinct.  Basal cisterns are not effaced.  No evidence of acute intracranial hemorrhage.  Vascular calcifications in the intracranial arteries. No depressed skull fractures.  Visualized mastoid air cells are not opacified.  Opacification of the right ethmoid air cells and right maxillary antrum, progressing since the previous study, most consistent with inflammatory process.  No significant change in the intracranial contents since the previous study.  IMPRESSION: No evidence of acute intracranial hemorrhage, mass lesion, or acute infarct.  Opacification of right ethmoid air cells and maxillary antra.  Original Report Authenticated By: Marlon Pel, M.D.   Dg Chest Port 1 View  08/29/2011  *RADIOLOGY REPORT*  Clinical Data: Central line placement.  PORTABLE CHEST - 1 VIEW  Comparison: Chest x-ray 08/28/2011.  Findings: The left subclavian central venous catheter tip is in the mid SVC.  No complicating features.  The NG tube is coursing down the esophagus and into the stomach.  The cardiac silhouette, mediastinal and hilar contours are stable.  Stable surgical and radiation changes involving the right hilum and stable left basilar scarring.  IMPRESSION: Left subclavian center venous catheter in good position.  The tip is in the mid SVC.  No complicating features.  Original Report  Authenticated By: Demetrius Charity. Loralie Champagne, M.D.   Dg Chest Port 1 View  08/28/2011  *RADIOLOGY REPORT*  Clinical Data: Shortness of breath.  Low blood  pressure.  Lung cancer post therapy.  PORTABLE CHEST - 1 VIEW  Comparison: 02/22/2010 chest x-ray.  03/20/2011 CT.  Findings: Fullness of the right hilum appears slightly more prominent than on the prior chest x-ray.  This may be related to differences in technique although recurrent tumor at this level is not excluded.  Left base infiltrate/atelectasis new since prior examination.  Central pulmonary vascular prominence without pulmonary edema.  Calcified mildly tortuous aorta.  Remote right humeral neck fracture.  Abnormal small bowel loops along the right hemidiaphragm measuring up to 4 cm.  If there is clinical suspicion for primary abdominal/small bowel abnormality, follow-up imaging whether by plain film or CT recommended.  IMPRESSION: Fullness of the right hilum appears slightly more prominent than on the prior chest x-ray.  This may be related to differences in technique although recurrent tumor at this level is not excluded.  Left base infiltrate/atelectasis new since prior examination.  Abnormal dilated gas-filled small bowel loops.  Results discussed with Dr.Steinl.  Original Report Authenticated By: Fuller Canada, M.D.   Dg Abd Acute W/chest  08/30/2011  *RADIOLOGY REPORT*  Clinical Data: Follow up small bowel obstruction, abdominal pain  ACUTE ABDOMEN SERIES (ABDOMEN 2 VIEW & CHEST 1 VIEW)  Comparison: CT abdomen pelvis of 08/28/2011 and portable chest x- ray of 08/28/2011  Findings: There is still opacity at the left lung base, suspicious for pneumonia.  The lungs are hyperaerated consistent with COPD. Prominent hilar structures remain possibly reflecting a degree of pulmonary arterial hypertension in this patient with COPD.  The left central venous line is noted with the tip in the mid SVC and no pneumothorax is seen.  An NG tube is present.  Portable supine and left lateral decubitus film of the abdomen show little change in the high-grade small bowel obstruction with gaseous distention of small  bowel loops and possible mild edema of small bowel.  However on the decubitus film, no free air is seen. The bones are osteopenic.  An NG tube is present with the tip in the body of the stomach.  IMPRESSION:  1.  No significant change in high-grade small bowel obstruction. No free air. 2.  Opacity remains medially at the left lung base suspicious for pneumonia. 3.  NG tube tip in mid body of stomach. 4.  Left central venous line tip in mid SVC. 5.  COPD.  Original Report Authenticated By: Juline Patch, M.D.   Medications: Scheduled Meds:    . antiseptic oral rinse  15 mL Mouth Rinse q12n4p  . chlorhexidine  15 mL Mouth Rinse BID  . enoxaparin (LOVENOX) injection  40 mg Subcutaneous Q24H  . folic acid  1 mg Intravenous Daily  . lisinopril  10 mg Oral Daily  . metronidazole  500 mg Intravenous Q8H  . pantoprazole (PROTONIX) IV  40 mg Intravenous Q24H  . piperacillin-tazobactam (ZOSYN)  IV  3.375 g Intravenous Q8H  . sodium chloride  10 mL Intracatheter Q12H  . thiamine  100 mg Intravenous Daily  . DISCONTD: enalaprilat  1.25 mg Intravenous Q6H   Continuous Infusions:    . sodium chloride 20 mL/hr (08/30/11 0512)  . 0.9 % NaCl with KCl 40 mEq / L 75 mL/hr at 09/04/11 0127   PRN Meds:.albuterol, HYDROmorphone (DILAUDID) injection, ipratropium, ondansetron (ZOFRAN) IV, sodium chloride, DISCONTD: albuterol, DISCONTD:  ipratropium  Assessment/Plan: #1 s/p Exploratory lap with adhesiolysis.Patient tolerating po feeds.Will however continue iv abx #2 ?copd-will continue nebs prn #3 Anemia- hgb level stable #4 HTN-Bp fairly controlled.Will continue lisinopril Will d/c patient when cleared by the surgeon.    LOS: 7 days   Braidon Chermak 09/04/2011, 7:37 AM

## 2011-09-04 NOTE — Progress Notes (Signed)
Seen and agree  

## 2011-09-04 NOTE — Progress Notes (Signed)
5 Days Post-Op  Subjective: Pt feeling better. +BMs Mild nausea but no vomiting, wants to eat more.  Objective: Vital signs in last 24 hours: Temp:  [98 F (36.7 C)-100.6 F (38.1 C)] 98 F (36.7 C) (12/17 0429) Pulse Rate:  [83-93] 83  (12/17 0429) Resp:  [18-19] 18  (12/17 0429) BP: (101-127)/(54-78) 116/78 mmHg (12/17 0429) SpO2:  [92 %-98 %] 98 % (12/17 0429) Weight:  [115 lb 4.8 oz (52.3 kg)] 115 lb 4.8 oz (52.3 kg) (12/16 2045) Last BM Date:  (prior to admission)  Intake/Output this shift: Total I/O In: 240 [P.O.:240] Out: -   Physical Exam: BP 116/78  Pulse 83  Temp(Src) 98 F (36.7 C) (Oral)  Resp 18  Ht 5\' 6"  (1.676 m)  Wt 115 lb 4.8 oz (52.3 kg)  BMI 18.61 kg/m2  SpO2 98% Lungs: CTA without w/r/r Heart: Regular Abdomen: soft, ND, appropriately tender   Incisions all c/d/i without erythema or hematoma. Ext: No edema or tenderness   Labs: CBC  Basename 09/04/11 0615 09/03/11 0855  WBC 11.5* 10.2  HGB 11.7* 11.4*  HCT 35.2* 34.5*  PLT 368 345   BMET  Basename 09/04/11 0615 09/03/11 0855  NA 138 138  K 4.0 3.8  CL 106 105  CO2 23 25  GLUCOSE 86 83  BUN 4* 6  CREATININE 0.61 0.59  CALCIUM 8.5 8.5   LFT  Basename 09/04/11 0615  PROT 5.4*  ALBUMIN 2.4*  AST 18  ALT 13  ALKPHOS 64  BILITOT 0.4  BILIDIR --  IBILI --  LIPASE --   PT/INR No results found for this basename: LABPROT:2,INR:2 in the last 72 hours ABG No results found for this basename: PHART:2,PCO2:2,PO2:2,HCO3:2 in the last 72 hours  Studies/Results: No results found.  Assessment: Active Problems:  Small bowel obstruction Procedure(s): EXPLORATORY LAPAROTOMY, LOA  Plan: Advance diet. Encouraged activity and IS use.  LOS: 7 days    Marianna Fuss 09/04/2011

## 2011-09-05 DIAGNOSIS — E871 Hypo-osmolality and hyponatremia: Secondary | ICD-10-CM | POA: Diagnosis present

## 2011-09-05 DIAGNOSIS — N179 Acute kidney failure, unspecified: Secondary | ICD-10-CM | POA: Diagnosis present

## 2011-09-05 DIAGNOSIS — E873 Alkalosis: Secondary | ICD-10-CM | POA: Diagnosis present

## 2011-09-05 DIAGNOSIS — D72829 Elevated white blood cell count, unspecified: Secondary | ICD-10-CM | POA: Diagnosis present

## 2011-09-05 LAB — GLUCOSE, CAPILLARY
Glucose-Capillary: 100 mg/dL — ABNORMAL HIGH (ref 70–99)
Glucose-Capillary: 113 mg/dL — ABNORMAL HIGH (ref 70–99)

## 2011-09-05 LAB — COMPREHENSIVE METABOLIC PANEL
AST: 22 U/L (ref 0–37)
Albumin: 2.2 g/dL — ABNORMAL LOW (ref 3.5–5.2)
Alkaline Phosphatase: 53 U/L (ref 39–117)
BUN: 4 mg/dL — ABNORMAL LOW (ref 6–23)
CO2: 23 mEq/L (ref 19–32)
Chloride: 108 mEq/L (ref 96–112)
Creatinine, Ser: 0.69 mg/dL (ref 0.50–1.10)
GFR calc non Af Amer: 87 mL/min — ABNORMAL LOW (ref >90)
Potassium: 4.5 mEq/L (ref 3.5–5.1)
Total Bilirubin: 0.3 mg/dL (ref 0.3–1.2)

## 2011-09-05 LAB — CBC
HCT: 35.4 % — ABNORMAL LOW (ref 36.0–46.0)
MCV: 104.4 fL — ABNORMAL HIGH (ref 78.0–100.0)
Platelets: 353 10*3/uL (ref 150–400)
RBC: 3.39 MIL/uL — ABNORMAL LOW (ref 3.87–5.11)
RDW: 13.9 % (ref 11.5–15.5)
WBC: 14.2 10*3/uL — ABNORMAL HIGH (ref 4.0–10.5)

## 2011-09-05 MED ORDER — IPRATROPIUM BROMIDE 0.02 % IN SOLN: 500.0000 ug | Freq: Four times a day (QID) | RESPIRATORY_TRACT | Status: AC

## 2011-09-05 MED ORDER — LEVOFLOXACIN 500 MG PO TABS: 500.0000 mg | ORAL_TABLET | Freq: Every day | ORAL | Status: AC

## 2011-09-05 NOTE — Progress Notes (Signed)
Physical Therapy Evaluation Patient Details Name: Lori [name] MRN: [id number] DOB: July 29, 1941 Today's Date: 09/05/2011  Problem List:  Patient Active Problem List  Diagnoses  . Small bowel obstruction  . Hyponatremia  . ARF (acute renal failure)  . Leukocytosis    Past Medical History:  Past Medical History  Diagnosis Date  . Hypertension   . Shortness of breath 08/28/11    "all the time this past week"  . Chronic back pain greater than 3 months duration   . GERD (gastroesophageal reflux disease)     "when she was doing chemo"  . Lung cancer dx'd 04/2008    S/P chemo, radiation   Past Surgical History:  Past Surgical History  Procedure Date  . Appendectomy   . Abdominal hysterectomy 1972  . Fiberoptic bronchoscopy 04/2008    w/mediastinoscopy  . Laparotomy 08/30/2011    Procedure: EXPLORATORY LAPAROTOMY;  Surgeon: Wilmon Arms. Corliss Skains, MD;  Location: [facility] OR;  Service: General;  Laterality: N/A;    PT Assessment/Plan/Recommendation PT Assessment Clinical Impression Statement: Pt. with concerns about gait stability after being admitted for small bowel obstruction and undergoing exploratory laparotomy.  Pt. reports that she uses a quad cane at home but does not feel that it gives her enough stability and would like a RW.  Pt. also stated that she uses a lawn chair in the shower, and it was explained to her that she could use a shower seat if she felt that the lawn chair would no longer be a suitable option, and that it would also be a safer option.  Pt. ambulated with modified independence and was able to demonstrate how she would navigate stairs with a handrail on the right and get her walker up  the stairs on the left.  Pt. with no other acute PT needs at this time.  PT Recommendation/Assessment: Patent does not need any further PT services No Skilled PT: Patient at baseline level of functioning PT Recommendation Follow Up Recommendations: None Equipment Recommended: Rolling  walker with 5" wheels PT Goals     PT Evaluation Precautions/Restrictions    Prior Functioning  Home Living Lives With: Other (Comment) (house mate) Receives Help From: Family (Son and daughter are neighbors) Type of Home: House Home Layout: Laundry or work area in basement Alternate Level Stairs-Rails: Can reach both Alternate Level Stairs-Number of Steps: Flight to basement Home Access: Stairs to enter Entrance Stairs-Rails: Right Secretary/administrator of Steps: 4 Bathroom Shower/Tub: Health visitor: Handicapped height Home Adaptive Equipment: Quad cane (Pt. uses lawn chair in shower) Prior Function Level of Independence: Needs assistance with homemaking;Independent with basic ADLs;Independent with transfers;Independent with gait;Requires assistive device for independence Meal Prep: Maximal Light Housekeeping: Moderate Driving: Yes Vocation: Retired Producer, television/film/video: Awake/alert Overall Cognitive Status: Appears within functional limits for tasks assessed Orientation Level: Oriented X4 Sensation/Coordination   Extremity Assessment RLE Assessment RLE Assessment: Within Functional Limits LLE Assessment LLE Assessment: Within Functional Limits Mobility (including Balance) Transfers Transfers: Yes Sit to Stand: 7: Independent;From bed Stand to Sit: 7: Independent;To bed Ambulation/Gait Ambulation/Gait: Yes Ambulation/Gait Assistance: 6: Modified independent (Device/Increase time) Ambulation Distance (Feet): 250 Feet Assistive device: Rolling walker Gait Pattern: Within Functional Limits Stairs: Yes Stairs Assistance: 6: Modified independent (Device/Increase time) Stair Management Technique: One rail Right;Forwards;Step to pattern Number of Stairs: 4  Height of Stairs: 8     Exercise    End of Session PT - End of Session Activity Tolerance: Patient tolerated treatment well Patient left: in bed;with call  bell in  reach Nurse Communication: Mobility status for ambulation;Mobility status for transfers General Behavior During Session: [facility] for tasks performed Cognition: [facility] for tasks performed  Laney Pastor, SPT  09/05/2011, 9:32 AM

## 2011-09-05 NOTE — Progress Notes (Signed)
Seen and agree with SPT note Chrsitopher Wik Tabor, PT [phone]  

## 2011-09-05 NOTE — Discharge Summary (Addendum)
HOSPITAL DISCHARGE SUMMARY  Lori [name]  MRN: [id number]  DOB:10-19-1940  Date of Admission: 08/28/2011 Date of Discharge: 09/05/2011         LOS: 8 days   Attending Physician:Lori [name]  Patient's WUJ:WJXBJY,NWGNFA Lori [name]  Consults:    PCCM  Discharge Diagnoses: Present on Admission:  .Small bowel obstruction .Metabolic alkalosis .Hyponatremia .ARF (acute renal failure) .Leukocytosis   Current Discharge Medication List    START taking these medications   Details  ipratropium (ATROVENT) 0.02 % nebulizer solution Take 2.5 mLs (500 mcg total) by nebulization 4 (four) times daily. Qty: 75 mL, Refills: 2    levofloxacin (LEVAQUIN) 500 MG tablet Take 1 tablet (500 mg total) by mouth daily. Qty: 5 tablet, Refills: 0      CONTINUE these medications which have NOT CHANGED   Details  lisinopril (PRINIVIL,ZESTRIL) 10 MG tablet Take 10 mg by mouth daily.           Brief Admission History: 70 yo WF brought to [facility] ED after 1 week of lower abdominal pain, nausea and vomiting. There was no fever or diarrhea. She also reports decreased oral intake. She continued to drink alcohol daily until 2 days ago. CT scan demonstrated high grade SBO but no free air.  Hospital Course: Present on Admission:  .Small bowel obstruction .Metabolic alkalosis .Hyponatremia .ARF (acute renal failure) .Leukocytosis  1. Small bowel obstruction: Patient admitted to the hospital on 08/28/2011. At the time of admission general surgery was consulted. Time of admission CT scan showed high grade small bowel bowel obstruction at the level of the distal jejunum with transition zone noted in the lower anterior pelvis. This usually associated with adhesions. After 2 days patient did not have any bowel movements nor passing gas so she was undergone exploratory laparotomy done by Dr. Corliss [name] on December 12. After the laparotomy patient recovered slowly, and she started to pass gas and then on the 17th,  she did have multiple bowel movements. Patient is tolerating regular food. And patient was felt appropriate for discharge today. Followup was sent to [place] [facility] to remove the staples as well as postoperative followup.  2. Acute renal failure and metabolic acidosis: This is [name] time of admission probably secondary to dehydration, this is been resolved after aggressive IV fluids hydration initiated.  3. Leukocytosis: The time of admission patient did have [name] WBC scan of 17,000. This high number was thought to be secondary to stress. And it did trend down to 10.2, then yesterday and today the white blood count was 11.5 and 14.2 respectively.. The patient was on Zosyn, as the surgery service I do not think it's intra-abdominal infection. Patient is cough and so I'm treating her for bronchitis. With Levaquin for 5 more days. Patient to followup as outpatient with her primary care physician.  4. Hypertension: This is been stable patient is in the lisinopril this was held at the time of admission because of renal failure it was restarted now blood pressure very stable.  Day of Discharge BP 128/80  Pulse 89  Temp(Src) 98.2 F (36.8 C) (Oral)  Resp 18  Ht 5\' 6"  (1.676 m)  Wt 53.3 kg (117 lb 8.1 oz)  BMI 18.97 kg/m2  SpO2 98% Physical Exam: GEN: No acute distress, cooperative with exam PSYCH: He is alert and oriented x4; does not appear anxious does not appear depressed; affect is normal  HEENT: Mucous membranes pink and anicteric;  Mouth: without oral thrush or lesions Eyes: PERRLA; EOM intact;  Neck:  no cervical lymphadenopathy nor thyromegaly or carotid bruit; no JVD;  CHEST WALL: No tenderness, symmetrical to breathing bilaterally CHEST: Normal respiration, clear to auscultation bilaterally  HEART: Regular rate and rhythm; no murmurs, rubs or gallops, S1 and S2 heard  BACK: No kyphosis or scoliosis; no CVA tenderness  ABDOMEN:  soft non-tender; no masses, no organomegaly, normal abdominal  bowel sounds; no pannus; no intertriginous candida.  EXTREMITIES: No bone or joint deformity; no edema; no ulcerations.  PULSES: 2+ and symmetric, neurovascularity is intact SKIN: Normal hydration no rash or ulceration, no flushing or suspicious lesions  CNS: Cranial nerves 2-12 grossly intact no focal neurologic deficit, coordination is intact gait not tested    Results for orders placed during the hospital encounter of 08/28/11 (from the past 24 hour(s))  GLUCOSE, CAPILLARY     Status: Abnormal   Collection Time   09/04/11  4:24 PM      Component Value Range   Glucose-Capillary 101 (*) 70 - 99 (mg/dL)  GLUCOSE, CAPILLARY     Status: Abnormal   Collection Time   09/04/11  9:51 PM      Component Value Range   Glucose-Capillary 112 (*) 70 - 99 (mg/dL)  GLUCOSE, CAPILLARY     Status: Abnormal   Collection Time   09/05/11  1:30 AM      Component Value Range   Glucose-Capillary 108 (*) 70 - 99 (mg/dL)  GLUCOSE, CAPILLARY     Status: Abnormal   Collection Time   09/05/11  4:25 AM      Component Value Range   Glucose-Capillary 104 (*) 70 - 99 (mg/dL)  CBC     Status: Abnormal   Collection Time   09/05/11  6:05 AM      Component Value Range   WBC 14.2 (*) 4.0 - 10.5 (K/uL)   RBC 3.39 (*) 3.87 - 5.11 (MIL/uL)   Hemoglobin 11.5 (*) 12.0 - 15.0 (g/dL)   HCT 16.1 (*) 09.6 - 46.0 (%)   MCV 104.4 (*) 78.0 - 100.0 (fL)   MCH 33.9  26.0 - 34.0 (pg)   MCHC 32.5  30.0 - 36.0 (g/dL)   RDW 04.5  40.9 - 81.1 (%)   Platelets 353  150 - 400 (K/uL)  COMPREHENSIVE METABOLIC PANEL     Status: Abnormal   Collection Time   09/05/11  6:05 AM      Component Value Range   Sodium 138  135 - 145 (mEq/L)   Potassium 4.5  3.5 - 5.1 (mEq/L)   Chloride 108  96 - 112 (mEq/L)   CO2 23  19 - 32 (mEq/L)   Glucose, Bld 96  70 - 99 (mg/dL)   BUN 4 (*) 6 - 23 (mg/dL)   Creatinine, Ser 9.14  0.50 - 1.10 (mg/dL)   Calcium 8.2 (*) 8.4 - 10.5 (mg/dL)   Total Protein 5.1 (*) 6.0 - 8.3 (g/dL)   Albumin 2.2 (*)  3.5 - 5.2 (g/dL)   AST 22  0 - 37 (U/L)   ALT 14  0 - 35 (U/L)   Alkaline Phosphatase 53  39 - 117 (U/L)   Total Bilirubin 0.3  0.3 - 1.2 (mg/dL)   GFR calc non Af Amer 87 (*) >90 (mL/min)   GFR calc Af Amer >90  >90 (mL/min)  GLUCOSE, CAPILLARY     Status: Abnormal   Collection Time   09/05/11  8:12 AM      Component Value Range   Glucose-Capillary 100 (*) 70 -  99 (mg/dL)  GLUCOSE, CAPILLARY     Status: Abnormal   Collection Time   09/05/11 11:29 AM      Component Value Range   Glucose-Capillary 113 (*) 70 - 99 (mg/dL)    Disposition: Home   Follow-up Appts: Discharge Orders    Future Appointments: Provider: Department: Dept Phone: Center:   09/22/2011 10:00 AM [facility] [phone] [facility]     Future Orders Please Complete By Expires   Diet - low sodium heart healthy      Increase activity slowly         Follow-up Information    Follow up with TSUEI,MATTHEW K., [name]. Make an appointment in 2 weeks.   Contact information:   [employer] [address], [address] [place] [address] [phone]       Follow up with Kaleen Mask in 1 week.   Contact information:   [address] [place] [address] [phone]       Follow up with CCS,[name]. Call in 1 day. (To schedule staples removal)    Contact information:   [address] [place] [address] [phone]          I spent 40 minutes completing paperwork and coordinating discharge efforts.  SignedClydia Llano [name] 09/05/2011, 3:07 PM

## 2011-09-05 NOTE — Progress Notes (Signed)
Per MD order, central line removed. IV cathter intact. Vaseline pressure gauze to site, pressure held x 5 min, no bleeding to site. Pt instructed to keep dressing CDI x 24hours, if bleeding occurs hold pressure, if bleeding does not stop contact MD or go to the ED. Pt verbalized understanding and did not have any questions Lori [name] M  

## 2011-09-05 NOTE — Progress Notes (Signed)
6 Days Post-Op  Subjective: Feels a little bloated this am, but passing gas and several BMs yesterday. Denies other c/o except some mild back pain.  Objective: Vital signs in last 24 hours: Temp:  [97.8 F (36.6 C)-98.7 F (37.1 C)] 98.7 F (37.1 C) (12/18 0427) Pulse Rate:  [89-135] 98  (12/18 0427) Resp:  [18-20] 18  (12/18 0427) BP: (113-134)/(75-88) 121/80 mmHg (12/18 0427) SpO2:  [95 %-98 %] 96 % (12/18 0427) Weight:  [117 lb 8.1 oz (53.3 kg)] 117 lb 8.1 oz (53.3 kg) (12/17 2211) Last BM Date: 09/04/11  Intake/Output this shift:    Physical Exam: BP 121/80  Pulse 98  Temp(Src) 98.7 F (37.1 C) (Oral)  Resp 18  Ht 5\' 6"  (1.676 m)  Wt 117 lb 8.1 oz (53.3 kg)  BMI 18.97 kg/m2  SpO2 96% Abdomen: mildly distended this am, active BS Incision c/d/i  Labs: CBC  Basename 09/05/11 0605 09/04/11 0615  WBC 14.2* 11.5*  HGB 11.5* 11.7*  HCT 35.4* 35.2*  PLT 353 368   BMET  Basename 09/05/11 0605 09/04/11 0615  NA 138 138  K 4.5 4.0  CL 108 106  CO2 23 23  GLUCOSE 96 86  BUN 4* 4*  CREATININE 0.69 0.61  CALCIUM 8.2* 8.5   LFT  Basename 09/05/11 0605  PROT 5.1*  ALBUMIN 2.2*  AST 22  ALT 14  ALKPHOS 53  BILITOT 0.3  BILIDIR --  IBILI --  LIPASE --   PT/INR No results found for this basename: LABPROT:2,INR:2 in the last 72 hours ABG No results found for this basename: PHART:2,PCO2:2,PO2:2,HCO3:2 in the last 72 hours  Studies/Results: No results found.  Assessment: Active Problems:  Small bowel obstruction   Procedure(s): EXPLORATORY LAPAROTOMY  Plan: Looks pretty good. WBC trending up, ?central line vs. Other No need for abx from abd standpoint. Encouraged OOB/activity  LOS: 8 days    Marianna Fuss 09/05/2011

## 2011-09-05 NOTE — Progress Notes (Signed)
   CARE MANAGEMENT NOTE 09/05/2011  Patient:  Lori [name],Lori [name]   Account Number:  [id number]  Date Initiated:  08/30/2011  Documentation initiated by:  Avie Arenas  Subjective/Objective Assessment:   Abd pain and vomiting - SBO - ETOH abuse.     Action/Plan:   Anticipated DC Date:  08/30/2011   Anticipated DC Plan:  HOME W HOME HEALTH SERVICES  In-house referral  Clinical Social Worker      DC Planning Services  CM consult      [facility] Choice  DURABLE MEDICAL EQUIPMENT   Choice offered to / List presented to:  C-4 Adult Children   DME arranged  WALKER - ROLLING  NEBULIZER MACHINE      DME agency  [facility]        Status of service:  Completed, signed off Medicare Important Message given?   (If response is "NO", the following Medicare IM given date fields will be blank) Date Medicare IM given:   Date Additional Medicare IM given:    Discharge Disposition:  HOME/SELF CARE  Per UR Regulation:  Reviewed for med. necessity/level of care/duration of stay  Comments:  09/05/11- 1440- Donn Pierini RN, BSN [phone] Pt for discharge today, order for RW - spoke with pt and daughter- per conversation daughter wants to get RW from [facility]- will make referral to [facility] to have RW delivered prior to pt being discharged. Referral sent via [facility] and call made to Derrian with [facility]. Pt and daughter also asked if MD had ordered a nebulizer machine for home- did not see order- page sent to MD to clarify need for nebulizer- awaiting call back.- MD returned call- to write for nebulizer machine and prescription for nebs. [facility] notified of need for nebulizer also. To bring to room prior to pt being discharged. Prescription for nebs given to pt's daughter.  08-30-11 11:40am Avie Arenas, RNBSN [phone] UR Completed. Plan for exp lap today and lysis of adhesions.

## 2011-09-15 ENCOUNTER — Encounter (INDEPENDENT_AMBULATORY_CARE_PROVIDER_SITE_OTHER): Payer: Self-pay | Admitting: Surgery

## 2011-09-15 ENCOUNTER — Ambulatory Visit (INDEPENDENT_AMBULATORY_CARE_PROVIDER_SITE_OTHER): Payer: Medicare Other | Admitting: Surgery

## 2011-09-15 VITALS — BP 130/80 | HR 72 | Temp 97.8°F | Resp 16 | Ht 64.5 in | Wt 117.2 lb

## 2011-09-15 DIAGNOSIS — Z09 Encounter for follow-up examination after completed treatment for conditions other than malignant neoplasm: Secondary | ICD-10-CM

## 2011-09-15 NOTE — Progress Notes (Signed)
Subjective:     Patient ID: Lori [name], female   DOB: 24-Jul-1941, 70 y.o.   MRN: [id number]  HPI This is a patient of Dr. Fatima Sanger who is status post exploratory laparotomy for bowel structure on December 10. She is here today for staple removal. She is doing well and has no complaints. She is eating well and moving her bowels well.  Review of Systems     Objective:   Physical Exam    On exam, her incision is well-healed. We removed the staples Assessment:     Patient status post exploratory lap for bowel obstruction    Plan:     She will continue to refrain from heavy lifting. She will keep her appointment next week

## 2011-09-16 ENCOUNTER — Telehealth: Payer: Self-pay | Admitting: Internal Medicine

## 2011-09-16 NOTE — Telephone Encounter (Signed)
S/w pt, gave appts 09/22/11 lab @ 9am and 10/12/11 md @ 10.30am.

## 2011-09-21 ENCOUNTER — Ambulatory Visit (INDEPENDENT_AMBULATORY_CARE_PROVIDER_SITE_OTHER): Payer: Medicare Other | Admitting: Surgery

## 2011-09-21 ENCOUNTER — Encounter (INDEPENDENT_AMBULATORY_CARE_PROVIDER_SITE_OTHER): Payer: Self-pay | Admitting: Surgery

## 2011-09-21 VITALS — BP 146/96 | HR 68 | Temp 97.8°F | Resp 18 | Ht 65.75 in | Wt 114.6 lb

## 2011-09-21 DIAGNOSIS — K56609 Unspecified intestinal obstruction, unspecified as to partial versus complete obstruction: Secondary | ICD-10-CM

## 2011-09-21 NOTE — Progress Notes (Signed)
The patient is here for a final postoperative visit after an exploratory laparotomy with lysis of adhesions for a small bowel obstruction. Her appetite is good. She is having bowel movements every day to day and a half. Her only pain is in her back which is unrelated to her surgery. Her incision seems to be well healed with no sign of infection. There is no sign of ventral hernia. She is using the electronic cigarette to try to cut back on her smoking.  Filed Vitals:   09/21/11 1137  BP: 146/96  Pulse: 68  Temp: 97.8 F (36.6 C)  Resp: 18    Incision well-healed with no sign of infection or hernia Active bowel sounds Non-tender  Imp:  SBO s/p exploratory laparotomy; she seems to be fully recovered from her surgery  Plan:  Resume full activity;  Follow-up PRN  Wilmon Arms. Corliss Skains, MD, [facility] Surgery  09/21/2011 12:38 PM

## 2011-09-22 ENCOUNTER — Encounter: Payer: Self-pay | Admitting: *Deleted

## 2011-09-22 ENCOUNTER — Other Ambulatory Visit: Payer: Self-pay | Admitting: Internal Medicine

## 2011-09-22 ENCOUNTER — Other Ambulatory Visit (HOSPITAL_BASED_OUTPATIENT_CLINIC_OR_DEPARTMENT_OTHER): Payer: Medicare Other

## 2011-09-22 ENCOUNTER — Ambulatory Visit (HOSPITAL_COMMUNITY)
Admission: RE | Admit: 2011-09-22 | Discharge: 2011-09-22 | Disposition: A | Discharge status: Home / Self Care | Payer: Medicare Other | Source: Ambulatory Visit | Attending: Internal Medicine | Admitting: Internal Medicine

## 2011-09-22 DIAGNOSIS — J438 Other emphysema: Secondary | ICD-10-CM | POA: Insufficient documentation

## 2011-09-22 DIAGNOSIS — D739 Disease of spleen, unspecified: Secondary | ICD-10-CM | POA: Insufficient documentation

## 2011-09-22 DIAGNOSIS — R0602 Shortness of breath: Secondary | ICD-10-CM | POA: Insufficient documentation

## 2011-09-22 DIAGNOSIS — Z923 Personal history of irradiation: Secondary | ICD-10-CM | POA: Insufficient documentation

## 2011-09-22 DIAGNOSIS — I2510 Atherosclerotic heart disease of native coronary artery without angina pectoris: Secondary | ICD-10-CM | POA: Insufficient documentation

## 2011-09-22 DIAGNOSIS — Z9221 Personal history of antineoplastic chemotherapy: Secondary | ICD-10-CM | POA: Insufficient documentation

## 2011-09-22 DIAGNOSIS — C3410 Malignant neoplasm of upper lobe, unspecified bronchus or lung: Secondary | ICD-10-CM

## 2011-09-22 DIAGNOSIS — C3490 Malignant neoplasm of unspecified part of unspecified bronchus or lung: Secondary | ICD-10-CM | POA: Insufficient documentation

## 2011-09-22 DIAGNOSIS — J984 Other disorders of lung: Secondary | ICD-10-CM | POA: Insufficient documentation

## 2011-09-22 LAB — CBC WITH DIFFERENTIAL/PLATELET
Basophils Absolute: 0 10*3/uL (ref 0.0–0.1)
Eosinophils Absolute: 0.2 10*3/uL (ref 0.0–0.5)
HCT: 38.4 % (ref 34.8–46.6)
HGB: 13 g/dL (ref 11.6–15.9)
MONO#: 0.6 10*3/uL (ref 0.1–0.9)
NEUT#: 4.6 10*3/uL (ref 1.5–6.5)
NEUT%: 69.1 % (ref 38.4–76.8)
RDW: 13.9 % (ref 11.2–14.5)
lymph#: 1.2 10*3/uL (ref 0.9–3.3)

## 2011-09-22 LAB — CMP (CANCER CENTER ONLY)
AST: 18 U/L (ref 11–38)
Albumin: 3.1 g/dL — ABNORMAL LOW (ref 3.3–5.5)
BUN, Bld: 11 mg/dL (ref 7–22)
CO2: 28 mEq/L (ref 18–33)
Calcium: 9.1 mg/dL (ref 8.0–10.3)
Chloride: 111 mEq/L — ABNORMAL HIGH (ref 98–108)
Glucose, Bld: 90 mg/dL (ref 73–118)
Potassium: 4.3 mEq/L (ref 3.3–4.7)

## 2011-09-22 MED ORDER — IOHEXOL 300 MG/ML  SOLN
80.0000 mL | Freq: Once | INTRAMUSCULAR | Status: AC | PRN
  Administered 2011-09-22: 80 mL via INTRAVENOUS

## 2011-09-22 NOTE — Progress Notes (Signed)
Received call from radiology regarding pt's CT scan done 09/22/11, Dr Donnald Garre not in office, showed report to AEJ, per AEJ, okay to show report to Dr [facility] Monday, (09/24/11), no further orders at this time, report placed on Dr [facility] desk for review.  SLJ

## 2011-09-25 ENCOUNTER — Telehealth: Payer: Self-pay | Admitting: Internal Medicine

## 2011-09-25 NOTE — Telephone Encounter (Signed)
Confirmed appt with pt for 1/24 to see Dr Donnald Garre. She is doing much better after emergency bowel surgery in December. She is using walker . I told her to call us if she needs [place] before the 24th .

## 2011-10-12 ENCOUNTER — Ambulatory Visit (HOSPITAL_BASED_OUTPATIENT_CLINIC_OR_DEPARTMENT_OTHER): Payer: Medicare Other | Admitting: Internal Medicine

## 2011-10-12 ENCOUNTER — Telehealth: Payer: Self-pay | Admitting: Internal Medicine

## 2011-10-12 ENCOUNTER — Ambulatory Visit: Payer: Medicare Other | Admitting: Internal Medicine

## 2011-10-12 VITALS — BP 152/86 | HR 81 | Temp 97.5°F | Ht 65.0 in | Wt 114.6 lb

## 2011-10-12 DIAGNOSIS — C3490 Malignant neoplasm of unspecified part of unspecified bronchus or lung: Secondary | ICD-10-CM

## 2011-10-12 NOTE — Telephone Encounter (Signed)
appts made for  7/19 and 7/23,printed for pt  aom

## 2011-10-15 NOTE — Progress Notes (Signed)
[facility] OFFICE PROGRESS NOTE  Kaleen Mask, MD, MD [address] [place] [address]  PRINCIPAL DIAGNOSIS:  Stage IIIA (T2, N2, MX) non-small cell lung cancer, adenocarcinoma diagnosed in August 2009.  PRIOR THERAPY:   1. Status post concurrent chemoradiation with weekly carboplatin and paclitaxel.  Last dose was given June 29, 2008. 2. Status post 3 cycles of consolidation chemotherapy with docetaxel, last dose was given September 15, 2008.  CURRENT THERAPY:  Observation.  INTERVAL HISTORY: Lori [name] 71 y.o. female returns to the clinic today for  6 months followup visit. The patient has no complaints today. She denied having any significant chest pain but continues to have shortness of breath at baseline and increased with exertion. Unfortunately she continues to smoke one pack a day. I had several discussion with the patient in the past regarding smoke cessation, but she mentioned that she is not ready to quit yet. She has repeat CT scan of the chest performed recently and she is here today for evaluation and discussion of her scan results.  MEDICAL HISTORY: Past Medical History  Diagnosis Date  . Hypertension   . Shortness of breath 08/28/11    "all the time this past week"  . Chronic back pain greater than 3 months duration   . GERD (gastroesophageal reflux disease)     "when she was doing chemo"  . Lung cancer dx'd 04/2008    S/P chemo, radiation    ALLERGIES:   has no known allergies.  MEDICATIONS:  Current Outpatient Prescriptions  Medication Sig Dispense Refill  . alendronate (FOSAMAX) 70 MG tablet every 7 (seven) days.       . calcium carbonate (OS-CAL) 600 MG TABS Take 600 mg by mouth 2 (two) times daily with a meal.        . Coenzyme Q10 (CO Q-10) 100 MG CAPS Take by mouth daily.        . ergocalciferol (VITAMIN D2) 50000 UNITS capsule Take 50,000 Units by mouth once a week. Patient takes every other week. Option  was not in database as of 09/15/11.       Marland Kitchen ipratropium (ATROVENT) 0.02 % nebulizer solution Take 2.5 mLs (500 mcg total) by nebulization 4 (four) times daily.  75 mL  2  . lisinopril (PRINIVIL,ZESTRIL) 10 MG tablet Take 10 mg by mouth daily.        . NON FORMULARY daily. Taking Chelated Mineral supplement by Marcie Mowers       . NON FORMULARY daily. Patient taking Active Calcium by Marcie Mowers.       . NON FORMULARY daily. Patient taking Mega Antioxidant from [place].       . NON FORMULARY daily. Patient taking Max GXL Furniture conservator/restorer)       . vitamin C (ASCORBIC ACID) 500 MG tablet Take 500 mg by mouth daily.          SURGICAL HISTORY:  Past Surgical History  Procedure Date  . Appendectomy   . Abdominal hysterectomy 1972  . Fiberoptic bronchoscopy 04/2008    w/mediastinoscopy  . Laparotomy 08/30/2011    Procedure: EXPLORATORY LAPAROTOMY;  Surgeon: Wilmon Arms. Corliss Skains, MD;  Location: [facility] OR;  Service: General;  Laterality: N/A;    REVIEW OF SYSTEMS:  A comprehensive review of systems was negative except for: Respiratory: positive for dyspnea on exertion   PHYSICAL EXAMINATION: General appearance: alert, cooperative and no distress Resp: clear to auscultation bilaterally Cardio: regular rate and  rhythm, S1, S2 normal, no murmur, click, rub or gallop GI: soft, non-tender; bowel sounds normal; no masses,  no organomegaly Extremities: extremities normal, atraumatic, no cyanosis or edema  ECOG PERFORMANCE STATUS: 1 - Symptomatic but completely ambulatory  Blood pressure 152/86, pulse 81, temperature 97.5 F (36.4 C), temperature source Oral, height 5\' 5"  (1.651 m), weight 114 lb 9.6 oz (51.982 kg).  LABORATORY DATA: Lab Results  Component Value Date   WBC 6.6 09/22/2011   HGB 13.0 09/22/2011   HCT 38.4 09/22/2011   MCV 101.2* 09/22/2011   PLT 503* 09/22/2011      Chemistry      Component Value Date/Time   NA 153* 09/22/2011 0908   NA 138 09/05/2011 0605   K 4.3 09/22/2011 0908   K 4.5 09/05/2011  0605   CL 111* 09/22/2011 0908   CL 108 09/05/2011 0605   CO2 28 09/22/2011 0908   CO2 23 09/05/2011 0605   BUN 11 09/22/2011 0908   BUN 4* 09/05/2011 0605   CREATININE 0.5* 09/22/2011 0908   CREATININE 0.69 09/05/2011 0605      Component Value Date/Time   CALCIUM 9.1 09/22/2011 0908   CALCIUM 8.2* 09/05/2011 0605   ALKPHOS 102* 09/22/2011 0908   ALKPHOS 53 09/05/2011 0605   AST 18 09/22/2011 0908   AST 22 09/05/2011 0605   ALT 14 09/05/2011 0605   BILITOT 0.40 09/22/2011 0908   BILITOT 0.3 09/05/2011 0605       RADIOGRAPHIC STUDIES: Ct Chest W Contrast  09/22/2011  *RADIOLOGY REPORT*  Clinical Data: Lung cancer diagnosed 04/2008.  Chemotherapy and radiation therapy complete.  Possible pulled muscle in left back / flank.  Pain causing shortness of breath for 1 week.  CT CHEST WITH CONTRAST  Technique:  Multidetector CT imaging of the chest was performed following the standard protocol during bolus administration of intravenous contrast.  Contrast: 80mL OMNIPAQUE IOHEXOL 300 MG/ML IV SOLN  Comparison: 09/02/2011 plain film.  Chest CT 03/20/2011.  Findings: Lung windows demonstrate moderate centrilobular emphysema.  Fluid/secretions within the dependent trachea. Biapical pleural thickening. 5 mm right upper lobe lung nodule on image 18, unchanged. Minimal nodularity along the right minor fissure on image 27 is unchanged. Probable subpleural lymph node along the anterior right lower lobe on image 45.  Redemonstration of right perihilar radiation change. New airspace disease within the left lower lobe dependently.  Soft tissue windows demonstrate normal heart size with a mild pectus excavatum deformity.  Minimal anterior pericardial fluid thickening is not significantly changed.  No pleural effusion. Minimal right pleural thickening superiorly.  Multivessel coronary artery atherosclerosis.  No central pulmonary embolism, on this non- dedicated study.  No mediastinal or left hilar adenopathy.  Soft tissue  fullness about the right hilum is favored to be radiation related.  This measures maximally 8 mm on image 28, unchanged.  There is soft tissue thickening at the distal right mainstem bronchus (posterior to the right upper lobe origin).  This measures 7 mm on image 25 and was absent on the prior.  Limited abdominal imaging demonstrates old granulomas disease in the spleen.  Subtle hyperenhancing subcapsular splenic lesion measures 7 mm on image 68.    Present back 10/07/2009.  Mildly prominent porta hepatis nodes, without adenopathy. Normal adrenal glands.   Moderate osteopenia.  Post-traumatic deformity of the sternal body.  Mild Schmorl's node deformity in the upper thoracic spine.  Favor mild L1 compression deformity over Schmorl's node.  IMPRESSION:  1.  New left lower  lobe airspace disease, suspicious for infection or aspiration. 2.  Radiation changes about the right hilum and paramediastinal right lung.  Subtle soft tissue thickening suspected about the posterior aspect of the distal right mainstem bronchus.  Consider short-term CT follow-up versus further characterization with PET. 3.  Scattered pulmonary nodules which are not significantly changed. 4.  Hyperenhancing splenic lesion is similar back to 10/07/2009 and favored to represent an incidental hemangioma or hamartoma.  Original Report Authenticated By: Consuello Bossier, M.D.    ASSESSMENT: This is a very pleasant 71 years old white female with history of stage IIIa non-small cell lung cancer status post concurrent chemoradiation followed by consolidation chemotherapy. The patient has been observation since December of 2009 with no evidence for disease progression. I discussed the scan results with the patient.  PLAN: #1 I recommended for the patient to continue on observation for now with repeat CT scan of the chest and follow up in 6 months. #2 I strongly encouraged the patient to quit smoking and offered to her smoke cessation class but she  declined.  All questions were answered. The patient knows to call the clinic with any problems, questions or concerns. We can certainly see the patient much sooner if necessary.

## 2011-10-26 ENCOUNTER — Encounter: Payer: Self-pay | Admitting: Internal Medicine

## 2011-10-26 NOTE — Progress Notes (Signed)
Patient approve for 100% Discount 10/26/11 - 10/25/12

## 2012-04-05 ENCOUNTER — Other Ambulatory Visit (HOSPITAL_BASED_OUTPATIENT_CLINIC_OR_DEPARTMENT_OTHER): Payer: Medicare Other | Admitting: Lab

## 2012-04-05 ENCOUNTER — Ambulatory Visit (HOSPITAL_COMMUNITY)
Admission: RE | Admit: 2012-04-05 | Discharge: 2012-04-05 | Disposition: A | Discharge status: Home / Self Care | Payer: Medicare Other | Source: Ambulatory Visit | Attending: Internal Medicine | Admitting: Internal Medicine

## 2012-04-05 DIAGNOSIS — J8410 Pulmonary fibrosis, unspecified: Secondary | ICD-10-CM | POA: Insufficient documentation

## 2012-04-05 DIAGNOSIS — R0602 Shortness of breath: Secondary | ICD-10-CM | POA: Insufficient documentation

## 2012-04-05 DIAGNOSIS — M899 Disorder of bone, unspecified: Secondary | ICD-10-CM | POA: Insufficient documentation

## 2012-04-05 DIAGNOSIS — R059 Cough, unspecified: Secondary | ICD-10-CM | POA: Insufficient documentation

## 2012-04-05 DIAGNOSIS — Y842 Radiological procedure and radiotherapy as the cause of abnormal reaction of the patient, or of later complication, without mention of misadventure at the time of the procedure: Secondary | ICD-10-CM | POA: Insufficient documentation

## 2012-04-05 DIAGNOSIS — K219 Gastro-esophageal reflux disease without esophagitis: Secondary | ICD-10-CM | POA: Insufficient documentation

## 2012-04-05 DIAGNOSIS — R05 Cough: Secondary | ICD-10-CM | POA: Insufficient documentation

## 2012-04-05 DIAGNOSIS — C3490 Malignant neoplasm of unspecified part of unspecified bronchus or lung: Secondary | ICD-10-CM

## 2012-04-05 LAB — CMP (CANCER CENTER ONLY)
Alkaline Phosphatase: 80 U/L (ref 26–84)
BUN, Bld: 9 mg/dL (ref 7–22)
CO2: 29 mEq/L (ref 18–33)
Creat: 0.6 mg/dl (ref 0.6–1.2)
Glucose, Bld: 88 mg/dL (ref 73–118)
Sodium: 138 mEq/L (ref 128–145)
Total Bilirubin: 0.7 mg/dl (ref 0.20–1.60)

## 2012-04-05 LAB — CBC WITH DIFFERENTIAL/PLATELET
Eosinophils Absolute: 0.2 10*3/uL (ref 0.0–0.5)
HCT: 41.9 % (ref 34.8–46.6)
LYMPH%: 18.2 % (ref 14.0–49.7)
MCV: 104.2 fL — ABNORMAL HIGH (ref 79.5–101.0)
MONO#: 0.6 10*3/uL (ref 0.1–0.9)
MONO%: 10 % (ref 0.0–14.0)
NEUT#: 4.1 10*3/uL (ref 1.5–6.5)
NEUT%: 68.5 % (ref 38.4–76.8)
Platelets: 312 10*3/uL (ref 145–400)
RBC: 4.03 10*6/uL (ref 3.70–5.45)
WBC: 6 10*3/uL (ref 3.9–10.3)

## 2012-04-05 MED ORDER — IOHEXOL 300 MG/ML  SOLN
100.0000 mL | Freq: Once | INTRAMUSCULAR | Status: AC | PRN
  Administered 2012-04-05: 100 mL via INTRAVENOUS

## 2012-04-09 ENCOUNTER — Ambulatory Visit (HOSPITAL_BASED_OUTPATIENT_CLINIC_OR_DEPARTMENT_OTHER): Payer: Medicare Other | Admitting: Internal Medicine

## 2012-04-09 ENCOUNTER — Telehealth: Payer: Self-pay | Admitting: Internal Medicine

## 2012-04-09 ENCOUNTER — Encounter: Payer: Self-pay | Admitting: *Deleted

## 2012-04-09 VITALS — BP 113/69 | HR 106 | Temp 96.9°F

## 2012-04-09 DIAGNOSIS — C3490 Malignant neoplasm of unspecified part of unspecified bronchus or lung: Secondary | ICD-10-CM | POA: Insufficient documentation

## 2012-04-09 NOTE — Progress Notes (Signed)
[facility] [phone]   Fax:([phone]  OFFICE PROGRESS NOTE  Kaleen Mask, MD [address] [place] [address]  PRINCIPAL DIAGNOSIS: Stage IIIA (T2, N2, MX) non-small cell lung cancer, adenocarcinoma diagnosed in August 2009.   PRIOR THERAPY:  1. Status post concurrent chemoradiation with weekly carboplatin and paclitaxel. Last dose was given June 29, 2008. 2. Status post 3 cycles of consolidation chemotherapy with docetaxel, last dose was given September 15, 2008.  CURRENT THERAPY: Observation.  INTERVAL HISTORY: Lori [name] 71 y.o. female returns to the clinic today for six-month followup visit. The patient is feeling fine today with no specific complaints except for shortness breath at baseline and increased with exertion. She denied having any significant chest pain or hemoptysis. She has no significant weight loss or night sweats. Unfortunately she continues to smoke one and half pack per day, down from 3 packs per day. She has repeat CT scan of the chest performed recently and she is here today for evaluation and discussion of her scan results.  MEDICAL HISTORY: Past Medical History  Diagnosis Date  . Hypertension   . Shortness of breath 08/28/11    "all the time this past week"  . Chronic back pain greater than 3 months duration   . GERD (gastroesophageal reflux disease)     "when she was doing chemo"  . Lung cancer dx'd 04/2008    S/P chemo, radiation    ALLERGIES:   has no known allergies.  MEDICATIONS:  Current Outpatient Prescriptions  Medication Sig Dispense Refill  . alendronate (FOSAMAX) 70 MG tablet every 7 (seven) days.       . calcium carbonate (OS-CAL) 600 MG TABS Take 600 mg by mouth 2 (two) times daily with a meal.        . Coenzyme Q10 (CO Q-10) 100 MG CAPS Take by mouth daily.        . ergocalciferol (VITAMIN D2) 50000 UNITS capsule Take 50,000 Units by mouth once a week. Patient takes  every other week. Option was not in database as of 09/15/11.       Marland Kitchen lisinopril (PRINIVIL,ZESTRIL) 10 MG tablet Take 10 mg by mouth daily.        . NON FORMULARY daily. Taking Chelated Mineral supplement by Marcie Mowers       . NON FORMULARY daily. Patient taking Active Calcium by Marcie Mowers.       . NON FORMULARY daily. Patient taking Mega Antioxidant from [place].       . NON FORMULARY daily. Patient taking Max GXL Furniture conservator/restorer)       . ipratropium (ATROVENT) 0.02 % nebulizer solution Take 2.5 mLs (500 mcg total) by nebulization 4 (four) times daily.  75 mL  2  . vitamin C (ASCORBIC ACID) 500 MG tablet Take 500 mg by mouth daily.          SURGICAL HISTORY:  Past Surgical History  Procedure Date  . Appendectomy   . Abdominal hysterectomy 1972  . Fiberoptic bronchoscopy 04/2008    w/mediastinoscopy  . Laparotomy 08/30/2011    Procedure: EXPLORATORY LAPAROTOMY;  Surgeon: Wilmon Arms. Tsuei, MD;  Location: [facility] OR;  Service: General;  Laterality: N/A;    REVIEW OF SYSTEMS:  A comprehensive review of systems was negative except for: Respiratory: positive for cough and dyspnea on exertion   PHYSICAL EXAMINATION: General appearance: alert, cooperative and no distress Neck: no adenopathy Lymph nodes: Cervical, supraclavicular, and axillary nodes normal. Resp:  wheezes bilaterally Cardio: regular rate and rhythm, S1, S2 normal, no murmur, click, rub or gallop GI: soft, non-tender; bowel sounds normal; no masses,  no organomegaly Extremities: extremities normal, atraumatic, no cyanosis or edema Neurologic: Alert and oriented X 3, normal strength and tone. Normal symmetric reflexes. Normal coordination and gait  ECOG PERFORMANCE STATUS: 1 - Symptomatic but completely ambulatory  Blood pressure 113/69, pulse 106, temperature 96.9 F (36.1 C), temperature source Oral.  LABORATORY DATA: Lab Results  Component Value Date   WBC 6.0 04/05/2012   HGB 14.1 04/05/2012   HCT 41.9 04/05/2012   MCV 104.2*  04/05/2012   PLT 312 04/05/2012      Chemistry      Component Value Date/Time   NA 153* 09/22/2011 0908   NA 138 09/05/2011 0605   K 4.3 09/22/2011 0908   K 4.5 09/05/2011 0605   CL 111* 09/22/2011 0908   CL 108 09/05/2011 0605   CO2 28 09/22/2011 0908   CO2 23 09/05/2011 0605   BUN 11 09/22/2011 0908   BUN 4* 09/05/2011 0605   CREATININE 0.5* 09/22/2011 0908   CREATININE 0.69 09/05/2011 0605      Component Value Date/Time   CALCIUM 9.1 09/22/2011 0908   CALCIUM 8.2* 09/05/2011 0605   ALKPHOS 102* 09/22/2011 0908   ALKPHOS 53 09/05/2011 0605   AST 18 09/22/2011 0908   AST 22 09/05/2011 0605   ALT 14 09/05/2011 0605   BILITOT 0.40 09/22/2011 0908   BILITOT 0.3 09/05/2011 0605       RADIOGRAPHIC STUDIES: Ct Chest W Contrast  04/05/2012  *RADIOLOGY REPORT*  Clinical Data: Lung cancer diagnosed in 2009.  Chemotherapy and radiation therapy complete.  Cough and shortness of breath. History small bowel obstruction.  Gastroesophageal reflux disease.Initially diagnosed with stage III non-small cell lung cancer in August 2009.  CT CHEST WITH CONTRAST  Technique:  Multidetector CT imaging of the chest was performed following the standard protocol during bolus administration of intravenous contrast.  Contrast: OMNIPAQUE IOHEXOL 300 MG/ML  SOLN  Comparison: CT of 09/22/2011.  Most recent clinic note of 10/12/2011.  Findings: Lung windows demonstrate fluid or secretions within the dependent trachea.  Patent airway, including on the right.  There is similar wall thickening involving the right mainstem bronchus and bronchus intermedius without obstruction.  Mild centrilobular emphysema.  Biapical pleural thickening.  5 mm  5 mm right upper lobe lung nodule is unchanged on image 20. Mild nodularity on the right minor fissure is similar on image 30. A nodule more anteriorly in the right lung on image 30 is unchanged and likely calcified at 3 mm.  Other scattered subpleural densities are likely subpleural lymph  nodes and again identified.  Right-sided peri mediastinal/perihilar radiation fibrosis is similar in morphology and distribution.  No evidence of locally recurrent disease.  Soft tissue windows demonstrate normal heart size with multivessel coronary artery atherosclerosis. No pericardial or pleural effusion.  No central pulmonary embolism, on this non-dedicated study.  Similar right-sided mediastinal soft tissue thickening, without well-defined adenopathy.  Nodal tissue or radiation induced thickening about the right hilum is unchanged at 8 mm on image 29. No left hilar adenopathy.  Limited abdominal imaging demonstrates possible hepatic steatosis, mild.  Perfusion anomaly in the high left lobe of the liver on image 53 was present back 10/07/2009 and detailed on that exam. Splenic enhancement at 7 mm on image 62 is unchanged.  Old granulomatous disease in the spleen.  Mild left adrenal thickening without  well-defined lesion.  Moderate osteopenia.  There is a minimally displaced 11th posterior left rib fracture on image 50 which is new.  There also nonacute fractures involving the anterolateral eighth through tenth left ribs, including on image 62.  New since the prior.  Remote sternal trauma.  Upper thoracic Schmorl's node deformity again identified.  L1 compression deformity is similar.  IMPRESSION:  1.  Similar appearance of radiation changes within the medial right hemithorax, without locally recurrent or metastatic disease. 2.  Nonacute but interval left sided rib fractures, as detailed above.  Correlate with trauma.  If no interval trauma, especially the posterior left eleventh rib fracture would be concerning for pathologic fracture.  No underlying lesion identified on the prior CT (even in retrospect). 3.  Possible hepatic steatosis with similar perfusion anomaly in the left lobe of the liver. 4.  Resolved left lower lobe infection or aspiration.  Original Report Authenticated By: Consuello Bossier, M.D.     ASSESSMENT: This is a very pleasant 71 years old white female with history of stage IIIa non-small cell lung cancer status post concurrent chemoradiation followed by 3 cycles of consolidation chemotherapy and has been observation since December of 2009.  PLAN: The patient has no evidence for disease progression on his recent scan. I recommended for her continuous observation with repeat CT scan of the chest in 6 months. I strongly encouraged the patient to quit smoking and she was given to smoke cessation material and advise from the lung Navigator.  All questions were answered. The patient knows to call the clinic with any problems, questions or concerns. We can certainly see the patient much sooner if necessary.

## 2012-04-09 NOTE — Telephone Encounter (Signed)
Gave pt appt date for January 17/2014  lab and CT, NPO 4 hrs prior to CT, then pt will see MD on 10/07/12

## 2012-04-09 NOTE — Progress Notes (Signed)
Spoke with pt regarding smoking cessation.  Educational/resource information given to pt and explained.

## 2012-10-04 ENCOUNTER — Encounter (HOSPITAL_COMMUNITY): Payer: Self-pay

## 2012-10-04 ENCOUNTER — Other Ambulatory Visit (HOSPITAL_BASED_OUTPATIENT_CLINIC_OR_DEPARTMENT_OTHER): Payer: Medicare Other | Admitting: Lab

## 2012-10-04 ENCOUNTER — Ambulatory Visit (HOSPITAL_COMMUNITY)
Admission: RE | Admit: 2012-10-04 | Discharge: 2012-10-04 | Disposition: A | Discharge status: Home / Self Care | Payer: Medicare Other | Source: Ambulatory Visit | Attending: Internal Medicine | Admitting: Internal Medicine

## 2012-10-04 DIAGNOSIS — C3490 Malignant neoplasm of unspecified part of unspecified bronchus or lung: Secondary | ICD-10-CM | POA: Insufficient documentation

## 2012-10-04 DIAGNOSIS — R911 Solitary pulmonary nodule: Secondary | ICD-10-CM | POA: Insufficient documentation

## 2012-10-04 DIAGNOSIS — K7689 Other specified diseases of liver: Secondary | ICD-10-CM | POA: Insufficient documentation

## 2012-10-04 LAB — CBC WITH DIFFERENTIAL/PLATELET
Basophils Absolute: 0.1 10*3/uL (ref 0.0–0.1)
EOS%: 1 % (ref 0.0–7.0)
Eosinophils Absolute: 0.1 10*3/uL (ref 0.0–0.5)
HGB: 14.8 g/dL (ref 11.6–15.9)
LYMPH%: 11.3 % — ABNORMAL LOW (ref 14.0–49.7)
MCH: 35.8 pg — ABNORMAL HIGH (ref 25.1–34.0)
MCV: 102.7 fL — ABNORMAL HIGH (ref 79.5–101.0)
MONO%: 7.6 % (ref 0.0–14.0)
NEUT#: 8.1 10*3/uL — ABNORMAL HIGH (ref 1.5–6.5)
NEUT%: 79.5 % — ABNORMAL HIGH (ref 38.4–76.8)
Platelets: 442 10*3/uL — ABNORMAL HIGH (ref 145–400)

## 2012-10-04 LAB — COMPREHENSIVE METABOLIC PANEL (CC13)
AST: 22 U/L (ref 5–34)
Alkaline Phosphatase: 95 U/L (ref 40–150)
BUN: 13 mg/dL (ref 7.0–26.0)
Glucose: 84 mg/dl (ref 70–99)
Potassium: 3.8 mEq/L (ref 3.5–5.1)
Sodium: 142 mEq/L (ref 136–145)
Total Bilirubin: 0.31 mg/dL (ref 0.20–1.20)
Total Protein: 7.7 g/dL (ref 6.4–8.3)

## 2012-10-04 MED ORDER — IOHEXOL 300 MG/ML  SOLN
80.0000 mL | Freq: Once | INTRAMUSCULAR | Status: AC | PRN
  Administered 2012-10-04: 80 mL via INTRAVENOUS

## 2012-10-07 ENCOUNTER — Encounter: Payer: Self-pay | Admitting: Internal Medicine

## 2012-10-07 ENCOUNTER — Telehealth: Payer: Self-pay | Admitting: Internal Medicine

## 2012-10-07 ENCOUNTER — Ambulatory Visit (HOSPITAL_BASED_OUTPATIENT_CLINIC_OR_DEPARTMENT_OTHER): Payer: Medicare Other | Admitting: Internal Medicine

## 2012-10-07 VITALS — BP 161/88 | HR 123 | Temp 97.4°F | Resp 20 | Ht 65.0 in | Wt 119.2 lb

## 2012-10-07 DIAGNOSIS — F17200 Nicotine dependence, unspecified, uncomplicated: Secondary | ICD-10-CM

## 2012-10-07 DIAGNOSIS — C3490 Malignant neoplasm of unspecified part of unspecified bronchus or lung: Secondary | ICD-10-CM

## 2012-10-07 NOTE — Telephone Encounter (Signed)
appts made and printed for pt pt aware that scan will follow the labs and they will call her

## 2012-10-07 NOTE — Progress Notes (Signed)
[facility] Telephone:([phone]   Fax:(336) [phone]  OFFICE PROGRESS NOTE  Kaleen Mask, MD [address] [place] [address]  PRINCIPAL DIAGNOSIS: Stage IIIA (T2, N2, MX) non-small cell lung cancer, adenocarcinoma diagnosed in August 2009.   PRIOR THERAPY:  1. Status post concurrent chemoradiation with weekly carboplatin and paclitaxel. Last dose was given June 29, 2008. 2. Status post 3 cycles of consolidation chemotherapy with docetaxel, last dose was given September 15, 2008.  CURRENT THERAPY: Observation.  INTERVAL HISTORY: Lori [name] 72 y.o. female returns to [facility] clinic today for six-month followup visit. Lori patient is feeling fine today with no specific complaints except for cough productive of greenish sputum as well as shortness breath with exertion. Unfortunately she continues to smoke one pack of cigarettes every day. She denied having any significant hemoptysis. She denied having any significant weight loss or night sweats. Lori patient had repeat CT scan of Lori chest performed recently and she is here for evaluation and discussion of her scan results.  MEDICAL HISTORY: Past Medical History  Diagnosis Date  . Hypertension   . Shortness of breath 08/28/11    "all Lori time this past week"  . Chronic back pain greater than 3 months duration   . GERD (gastroesophageal reflux disease)     "when she was doing chemo"  . Lung cancer dx'd 04/2008    S/P chemo, radiation    ALLERGIES:   has no known allergies.  MEDICATIONS:  Current Outpatient Prescriptions  Medication Sig Dispense Refill  . alendronate (FOSAMAX) 70 MG tablet every 7 (seven) days.       . calcium carbonate (OS-CAL) 600 MG TABS Take 600 mg by mouth 2 (two) times daily with a meal.        . Coenzyme Q10 (CO Q-10) 100 MG CAPS Take by mouth daily.        . ergocalciferol (VITAMIN D2) 50000 UNITS capsule Take 50,000 Units by mouth once a week. Patient takes  every other week. Option was not in database as of 09/15/11.       Marland Kitchen lisinopril (PRINIVIL,ZESTRIL) 10 MG tablet Take 10 mg by mouth daily.        . NON FORMULARY daily. Taking Chelated Mineral supplement by Marcie Mowers       . NON FORMULARY daily. Patient taking Active Calcium by Marcie Mowers.       . NON FORMULARY daily. Patient taking Mega Antioxidant from [place].       . NON FORMULARY daily. Patient taking Max GXL Furniture conservator/restorer)       . PROAIR HFA 108 (90 BASE) MCG/ACT inhaler       . vitamin C (ASCORBIC ACID) 500 MG tablet Take 500 mg by mouth daily.        Marland Kitchen ipratropium (ATROVENT) 0.02 % nebulizer solution Take 2.5 mLs (500 mcg total) by nebulization 4 (four) times daily.  75 mL  2    SURGICAL HISTORY:  Past Surgical History  Procedure Date  . Appendectomy   . Abdominal hysterectomy 1972  . Fiberoptic bronchoscopy 04/2008    w/mediastinoscopy  . Laparotomy 08/30/2011    Procedure: EXPLORATORY LAPAROTOMY;  Surgeon: Wilmon Arms. Corliss Skains, MD;  Location: [facility] OR;  Service: General;  Laterality: N/A;    REVIEW OF SYSTEMS:  A comprehensive review of systems was negative except for: Respiratory: positive for cough and dyspnea on exertion   PHYSICAL EXAMINATION: General appearance: alert, cooperative and no distress Head: Normocephalic,  without obvious abnormality, atraumatic Neck: no adenopathy Lymph nodes: Cervical, supraclavicular, and axillary nodes normal. Resp: wheezes bilaterally Cardio: regular rate and rhythm, S1, S2 normal, no murmur, click, rub or gallop GI: soft, non-tender; bowel sounds normal; no masses,  no organomegaly Extremities: extremities normal, atraumatic, no cyanosis or edema Neurologic: Alert and oriented X 3, normal strength and tone. Normal symmetric reflexes. Normal coordination and gait  ECOG PERFORMANCE STATUS: 1 - Symptomatic but completely ambulatory  Blood pressure 161/88, pulse 123, temperature 97.4 F (36.3 C), temperature source Oral, resp. rate 20, height  5\' 5"  (1.651 m), weight 119 lb 3.2 oz (54.069 kg).  LABORATORY DATA: Lab Results  Component Value Date   WBC 10.2 10/04/2012   HGB 14.8 10/04/2012   HCT 42.5 10/04/2012   MCV 102.7* 10/04/2012   PLT 442* 10/04/2012      Chemistry      Component Value Date/Time   NA 142 10/04/2012 0822   NA 138 04/05/2012 0749   NA 138 09/05/2011 0605   K 3.8 10/04/2012 0822   K 4.1 04/05/2012 0749   K 4.5 09/05/2011 0605   CL 97* 10/04/2012 0822   CL 96* 04/05/2012 0749   CL 108 09/05/2011 0605   CO2 31* 10/04/2012 0822   CO2 29 04/05/2012 0749   CO2 23 09/05/2011 0605   BUN 13.0 10/04/2012 0822   BUN 9 04/05/2012 0749   BUN 4* 09/05/2011 0605   CREATININE 0.8 10/04/2012 0822   CREATININE 0.6 04/05/2012 0749   CREATININE 0.69 09/05/2011 0605      Component Value Date/Time   CALCIUM 10.0 10/04/2012 0822   CALCIUM 9.1 04/05/2012 0749   CALCIUM 8.2* 09/05/2011 0605   ALKPHOS 95 10/04/2012 0822   ALKPHOS 80 04/05/2012 0749   ALKPHOS 53 09/05/2011 0605   AST 22 10/04/2012 0822   AST 28 04/05/2012 0749   AST 22 09/05/2011 0605   ALT 17 10/04/2012 0822   ALT 14 09/05/2011 0605   BILITOT 0.31 10/04/2012 0822   BILITOT 0.70 04/05/2012 0749   BILITOT 0.3 09/05/2011 0605       RADIOGRAPHIC STUDIES: Ct Chest W Contrast  10/04/2012  *RADIOLOGY REPORT*  Clinical Data: Lung cancer.  CT CHEST WITH CONTRAST  Technique:  Multidetector CT imaging of Lori chest was performed following Lori standard protocol during bolus administration of intravenous contrast.  Contrast: 80mL OMNIPAQUE IOHEXOL 300 MG/ML  SOLN  Comparison: 04/05/2012.  Findings: Lori chest wall is unremarkable and stable.  No breast mass, supraclavicular or axillary lymphadenopathy.  Small scattered lymph nodes are stable.  Lori bony thorax is unchanged.  Remote lower sternal fractures are noted along with remote rib fractures. No destructive bone lesions or spinal canal compromise. There is a progressive compression fracture of Lori T5 vertebral body with mild  sclerosis.  Stable compression fracture of L2.  Lori thyroid gland is normal.  Lori heart is normal in size.  No pericardial effusion.  No mediastinal or hilar lymphadenopathy.  Lori esophagus is grossly normal.  Stable advanced atherosclerotic calcifications involving Lori coronary arteries and stable atherosclerotic changes involving Lori aorta.  Stable radiation changes in Lori right paramediastinal lung and adjacent mediastinum.  Stable mild soft tissue thickening.  No progression or findings to suggest recurrent disease.  There are stable lung nodules bilaterally.  No new lesions.  Stable underlying emphysematous changes and biapical scarring.  No pleural effusion or acute pulmonary findings.  Lori upper abdomen demonstrates fatty infiltration of Lori liver. Stable nodularity involving Lori  left adrenal gland without discrete mass.  IMPRESSION: Stable CT appearance of Lori chest.  No findings to suggest recurrent or metastatic disease.   Original Report Authenticated By: Rudie Meyer, M.D.     ASSESSMENT: This is a very pleasant 72 years old white female with history of stage IIIa non-small cell lung cancer status post concurrent chemoradiation followed by consolidation chemotherapy and has been observation since December of 2009 with no evidence for disease progression.  PLAN: I discussed Lori scan results with Lori patient today. I recommended for her to continue on observation with repeat CT scan of Lori chest in 6 months. I strongly advise Lori patient to quit smoking and offer her smoke cessation program but she is unwilling to quit at this point. She was advised to call me immediately she has any concerning symptoms in Lori interval.  All questions were answered. Lori patient knows to call [facility]ori clinic with any problems, questions or concerns. We can certainly see Lori patient much sooner if necessary.

## 2012-10-07 NOTE — Patient Instructions (Signed)
No evidence for disease progression on his recent scan. Followup in 6 months

## 2012-11-07 ENCOUNTER — Encounter: Payer: Self-pay | Admitting: Internal Medicine

## 2012-11-07 NOTE — Progress Notes (Signed)
Pt is approved for 100% financial assistance effective 11/07/12 - 05/07/13.  I will mail approval letter and green card today.

## 2013-04-07 ENCOUNTER — Ambulatory Visit (HOSPITAL_COMMUNITY)
Admission: RE | Admit: 2013-04-07 | Discharge: 2013-04-07 | Disposition: A | Discharge status: Home / Self Care | Payer: Medicare Other | Source: Ambulatory Visit | Attending: Internal Medicine | Admitting: Internal Medicine

## 2013-04-07 ENCOUNTER — Other Ambulatory Visit (HOSPITAL_BASED_OUTPATIENT_CLINIC_OR_DEPARTMENT_OTHER): Payer: Medicare Other | Admitting: Lab

## 2013-04-07 DIAGNOSIS — C3490 Malignant neoplasm of unspecified part of unspecified bronchus or lung: Secondary | ICD-10-CM | POA: Insufficient documentation

## 2013-04-07 DIAGNOSIS — M439 Deforming dorsopathy, unspecified: Secondary | ICD-10-CM | POA: Insufficient documentation

## 2013-04-07 LAB — CBC WITH DIFFERENTIAL/PLATELET
BASO%: 0.8 % (ref 0.0–2.0)
EOS%: 2 % (ref 0.0–7.0)
HCT: 41.2 % (ref 34.8–46.6)
LYMPH%: 16.2 % (ref 14.0–49.7)
MCH: 36.4 pg — ABNORMAL HIGH (ref 25.1–34.0)
MCHC: 34.9 g/dL (ref 31.5–36.0)
MCV: 104.5 fL — ABNORMAL HIGH (ref 79.5–101.0)
MONO#: 0.9 10*3/uL (ref 0.1–0.9)
MONO%: 13.6 % (ref 0.0–14.0)
NEUT%: 67.4 % (ref 38.4–76.8)
Platelets: 289 10*3/uL (ref 145–400)

## 2013-04-07 LAB — COMPREHENSIVE METABOLIC PANEL (CC13)
Albumin: 3.9 g/dL (ref 3.5–5.0)
Alkaline Phosphatase: 93 U/L (ref 40–150)
BUN: 6 mg/dL — ABNORMAL LOW (ref 7.0–26.0)
Creatinine: 0.7 mg/dL (ref 0.6–1.1)
Glucose: 88 mg/dl (ref 70–140)
Total Bilirubin: 0.82 mg/dL (ref 0.20–1.20)

## 2013-04-07 MED ORDER — IOHEXOL 300 MG/ML  SOLN
80.0000 mL | Freq: Once | INTRAMUSCULAR | Status: AC | PRN
  Administered 2013-04-07: 80 mL via INTRAVENOUS

## 2013-04-09 ENCOUNTER — Ambulatory Visit (HOSPITAL_BASED_OUTPATIENT_CLINIC_OR_DEPARTMENT_OTHER): Payer: Medicare Other | Admitting: Internal Medicine

## 2013-04-09 ENCOUNTER — Encounter: Payer: Self-pay | Admitting: Internal Medicine

## 2013-04-09 ENCOUNTER — Telehealth: Payer: Self-pay | Admitting: Internal Medicine

## 2013-04-09 VITALS — BP 116/72 | HR 122 | Temp 97.1°F | Resp 20 | Ht 65.0 in | Wt 115.9 lb

## 2013-04-09 DIAGNOSIS — C3490 Malignant neoplasm of unspecified part of unspecified bronchus or lung: Secondary | ICD-10-CM

## 2013-04-09 NOTE — Telephone Encounter (Signed)
pt wanted me to mail appt.Marland KitchenMarland KitchenMarland KitchenDone

## 2013-04-09 NOTE — Progress Notes (Signed)
[facility] [phone]   [phone]  OFFICE PROGRESS NOTE  Kaleen Mask, MD [address] [place] [address]  PRINCIPAL DIAGNOSIS: Stage IIIA (T2, N2, MX) non-small cell lung cancer, adenocarcinoma diagnosed in August 2009.   PRIOR THERAPY:  1. Status post concurrent chemoradiation with weekly carboplatin and paclitaxel. Last dose was given June 29, 2008. 2. Status post 3 cycles of consolidation chemotherapy with docetaxel, last dose was given September 15, 2008.  CURRENT THERAPY: Observation.   INTERVAL HISTORY: Lori [name] 72 y.o. female returns to the clinic today for six-month followup visit. The patient is feeling fine today with no specific complaints. She denied having any significant chest pain, shortness breath except with exertion, cough or hemoptysis. The patient denied having any significant weight loss or night sweats. Unfortunately she lost her son recently to heart attack. She had repeat CT scan of the chest performed recently and she is here for evaluation and discussion of her scan results.  MEDICAL HISTORY: Past Medical History  Diagnosis Date  . Hypertension   . Shortness of breath 08/28/11    "all the time this past week"  . Chronic back pain greater than 3 months duration   . GERD (gastroesophageal reflux disease)     "when she was doing chemo"  . Lung cancer dx'd 04/2008    S/P chemo, radiation    ALLERGIES:  has No Known Allergies.  MEDICATIONS:  Current Outpatient Prescriptions  Medication Sig Dispense Refill  . alendronate (FOSAMAX) 70 MG tablet every 7 (seven) days.       . calcium carbonate (OS-CAL) 600 MG TABS Take 600 mg by mouth 2 (two) times daily with a meal.        . Coenzyme Q10 (CO Q-10) 100 MG CAPS Take by mouth daily.        . ergocalciferol (VITAMIN D2) 50000 UNITS capsule Take 50,000 Units by mouth once a week. Patient takes every other week. Option was not in database  as of 09/15/11.       Marland Kitchen ipratropium (ATROVENT) 0.02 % nebulizer solution Take 2.5 mLs (500 mcg total) by nebulization 4 (four) times daily.  75 mL  2  . lisinopril (PRINIVIL,ZESTRIL) 10 MG tablet Take 10 mg by mouth daily.        . NON FORMULARY daily. Taking Chelated Mineral supplement by Marcie Mowers       . NON FORMULARY daily. Patient taking Active Calcium by Marcie Mowers.       . NON FORMULARY daily. Patient taking Mega Antioxidant from [place].       . NON FORMULARY daily. Patient taking Max GXL Furniture conservator/restorer)       . PROAIR HFA 108 (90 BASE) MCG/ACT inhaler       . vitamin C (ASCORBIC ACID) 500 MG tablet Take 500 mg by mouth daily.         No current facility-administered medications for this visit.    SURGICAL HISTORY:  Past Surgical History  Procedure Laterality Date  . Appendectomy    . Abdominal hysterectomy  1972  . Fiberoptic bronchoscopy  04/2008    w/mediastinoscopy  . Laparotomy  08/30/2011    Procedure: EXPLORATORY LAPAROTOMY;  Surgeon: Wilmon Arms. Corliss Skains, MD;  Location: [facility] OR;  Service: General;  Laterality: N/A;    REVIEW OF SYSTEMS:  A comprehensive review of systems was negative except for: Respiratory: positive for dyspnea on exertion   PHYSICAL EXAMINATION: General appearance: alert, cooperative,  fatigued and no distress Head: Normocephalic, without obvious abnormality, atraumatic Neck: no adenopathy Lymph nodes: Cervical, supraclavicular, and axillary nodes normal. Resp: wheezes bilaterally Cardio: regular rate and rhythm, S1, S2 normal, no murmur, click, rub or gallop GI: soft, non-tender; bowel sounds normal; no masses,  no organomegaly Extremities: extremities normal, atraumatic, no cyanosis or edema  ECOG PERFORMANCE STATUS: 1 - Symptomatic but completely ambulatory  Blood pressure 116/72, pulse 122, temperature 97.1 F (36.2 C), temperature source Oral, resp. rate 20, height 5\' 5"  (1.651 m), weight 115 lb 14.4 oz (52.572 kg), SpO2 96.00%.  LABORATORY  DATA: Lab Results  Component Value Date   WBC 6.3 04/07/2013   HGB 14.4 04/07/2013   HCT 41.2 04/07/2013   MCV 104.5* 04/07/2013   PLT 289 04/07/2013      Chemistry      Component Value Date/Time   NA 135* 04/07/2013 0752   NA 138 04/05/2012 0749   NA 138 09/05/2011 0605   K 3.8 04/07/2013 0752   K 4.1 04/05/2012 0749   K 4.5 09/05/2011 0605   CL 97* 10/04/2012 0822   CL 96* 04/05/2012 0749   CL 108 09/05/2011 0605   CO2 28 04/07/2013 0752   CO2 29 04/05/2012 0749   CO2 23 09/05/2011 0605   BUN 6.0* 04/07/2013 0752   BUN 9 04/05/2012 0749   BUN 4* 09/05/2011 0605   CREATININE 0.7 04/07/2013 0752   CREATININE 0.6 04/05/2012 0749   CREATININE 0.69 09/05/2011 0605      Component Value Date/Time   CALCIUM 9.7 04/07/2013 0752   CALCIUM 9.1 04/05/2012 0749   CALCIUM 8.2* 09/05/2011 0605   ALKPHOS 93 04/07/2013 0752   ALKPHOS 80 04/05/2012 0749   ALKPHOS 53 09/05/2011 0605   AST 69* 04/07/2013 0752   AST 28 04/05/2012 0749   AST 22 09/05/2011 0605   ALT 56* 04/07/2013 0752   ALT 14 09/05/2011 0605   BILITOT 0.82 04/07/2013 0752   BILITOT 0.70 04/05/2012 0749   BILITOT 0.3 09/05/2011 0605       RADIOGRAPHIC STUDIES: Ct Chest W Contrast  04/07/2013   *RADIOLOGY REPORT*  Clinical Data: Lung cancer  CT CHEST WITH CONTRAST  Technique:  Multidetector CT imaging of the chest was performed following the standard protocol during bolus administration of intravenous contrast.  Contrast: 80mL OMNIPAQUE IOHEXOL 300 MG/ML  SOLN  Comparison: 10/04/2012  Findings: The lungs are well-aerated bilaterally.  Stable calcified and noncalcified parenchymal nodules are seen.  No new parenchymal mass is noted.  Biapical scarring is again identified.  Mild emphysematous changes are seen.  There are persistent changes in the right perihilar region consistent with prior radiation therapy.  The overall appearance is stable from prior exam.  No significant hilar or mediastinal adenopathy is identified.  The thoracic aorta and  pulmonary artery are well visualized and within normal limits.  Pressures demonstrate compression deformities of T5 and L2.  The T5 compression deformity again demonstrates sclerosis and is progressed slightly in the interval from prior exam.  The bony structures are otherwise stable from prior exam.  IMPRESSION: When compared with prior exam, no significant interval change is noted.  No findings to suggest recurrent disease are seen.   Original Report Authenticated By: Alcide Clever, M.D.    ASSESSMENT AND PLAN: This is a very pleasant 72 years old white female with history of stage IIIa non-small cell lung cancer status post concurrent chemoradiation followed by consolidation chemotherapy and has been observation since December of 2009  with no evidence for disease progression. I discussed the scan results with the patient today.  I recommended for her to continue on observation. I would see her back for followup visit in one year with repeat CT scan of the chest without contrast. The patient was advised to call immediately she has any concerning symptoms in the interval.  All questions were answered. The patient knows to call the clinic with any problems, questions or concerns. We can certainly see the patient much sooner if necessary.

## 2013-04-09 NOTE — Addendum Note (Signed)
Addended by: Charma Igo on: 04/09/2013 10:31 AM   Modules accepted: Orders

## 2013-04-09 NOTE — Patient Instructions (Signed)
No evidence for disease progression on the recent scan.  Followup visit in one year with repeat CT scan of the chest.

## 2013-12-04 ENCOUNTER — Telehealth: Payer: Self-pay | Admitting: Internal Medicine

## 2013-12-04 NOTE — Telephone Encounter (Signed)
Faxed pt medical records to Dr. Arelia Sneddon

## 2014-01-30 ENCOUNTER — Telehealth: Payer: Self-pay | Admitting: Internal Medicine

## 2014-01-30 NOTE — Telephone Encounter (Signed)
pt daughter called to cx all appts...pt passed

## 2014-02-03 ENCOUNTER — Other Ambulatory Visit: Payer: Self-pay | Admitting: *Deleted

## 2014-02-03 NOTE — Progress Notes (Signed)
Office note from 03/2013 sent to Dr Arelia Sneddon office per Dr Arelia Sneddon request.  Ted Mcalpine

## 2014-02-16 DEATH — deceased

## 2014-04-07 ENCOUNTER — Other Ambulatory Visit (HOSPITAL_COMMUNITY): Payer: Medicare Other

## 2014-04-07 ENCOUNTER — Other Ambulatory Visit: Payer: Medicare Other

## 2014-04-09 ENCOUNTER — Ambulatory Visit: Payer: Medicare Other | Admitting: Internal Medicine
